# Patient Record
Sex: Male | Born: 1935 | Hispanic: No | Smoking: Never smoker
Health system: Southern US, Community
[De-identification: ages and names within clinical notes are randomized; demographics above are authoritative.]

## PROBLEM LIST (undated history)

## (undated) DIAGNOSIS — K219 Gastro-esophageal reflux disease without esophagitis: Secondary | ICD-10-CM

## (undated) DIAGNOSIS — I10 Essential (primary) hypertension: Secondary | ICD-10-CM

## (undated) DIAGNOSIS — E039 Hypothyroidism, unspecified: Secondary | ICD-10-CM

## (undated) DIAGNOSIS — R569 Unspecified convulsions: Secondary | ICD-10-CM

## (undated) DIAGNOSIS — E785 Hyperlipidemia, unspecified: Secondary | ICD-10-CM

## (undated) DIAGNOSIS — J302 Other seasonal allergic rhinitis: Secondary | ICD-10-CM

## (undated) HISTORY — PX: JOINT REPLACEMENT: SHX530

## (undated) HISTORY — PX: REPLACEMENT TOTAL KNEE: SUR1224

---

## 2005-02-09 ENCOUNTER — Encounter: Payer: Self-pay | Admitting: *Deleted

## 2005-02-24 ENCOUNTER — Encounter: Payer: Self-pay | Admitting: *Deleted

## 2005-06-05 ENCOUNTER — Ambulatory Visit: Payer: Self-pay | Admitting: Psychiatry

## 2005-08-20 ENCOUNTER — Other Ambulatory Visit: Payer: Self-pay

## 2005-08-20 ENCOUNTER — Emergency Department: Payer: Self-pay | Admitting: Emergency Medicine

## 2005-11-17 ENCOUNTER — Emergency Department: Payer: Self-pay | Admitting: Emergency Medicine

## 2006-02-25 ENCOUNTER — Other Ambulatory Visit: Payer: Self-pay

## 2006-02-25 ENCOUNTER — Emergency Department: Payer: Self-pay

## 2006-03-25 ENCOUNTER — Emergency Department: Payer: Self-pay | Admitting: Emergency Medicine

## 2006-04-13 ENCOUNTER — Ambulatory Visit: Payer: Self-pay | Admitting: Psychiatry

## 2006-04-30 ENCOUNTER — Inpatient Hospital Stay: Payer: Self-pay | Admitting: Internal Medicine

## 2006-04-30 ENCOUNTER — Other Ambulatory Visit: Payer: Self-pay

## 2006-05-09 ENCOUNTER — Emergency Department: Payer: Self-pay | Admitting: Emergency Medicine

## 2006-05-09 ENCOUNTER — Other Ambulatory Visit: Payer: Self-pay

## 2006-05-12 ENCOUNTER — Ambulatory Visit: Payer: Self-pay | Admitting: Psychiatry

## 2006-12-18 IMAGING — XA DG OUTSIDE FILMS CHEST
10 of 15 series · 15 of 24 positions shown · non-contrast
Comparison: none

[Series 1: run · 3 of 48 slices shown (1 of 10)]
[im 1/48]
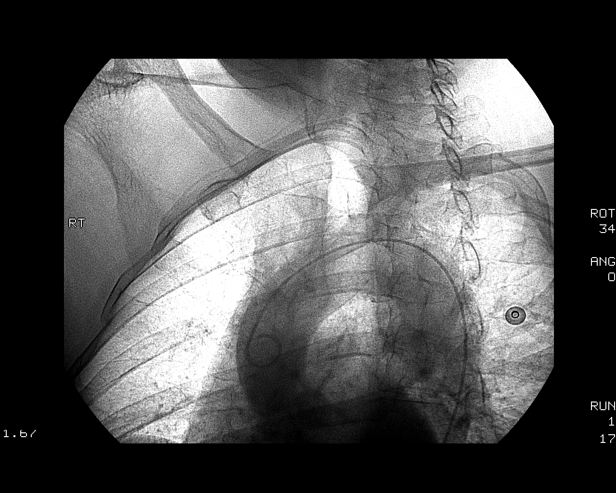
[im 24/48]
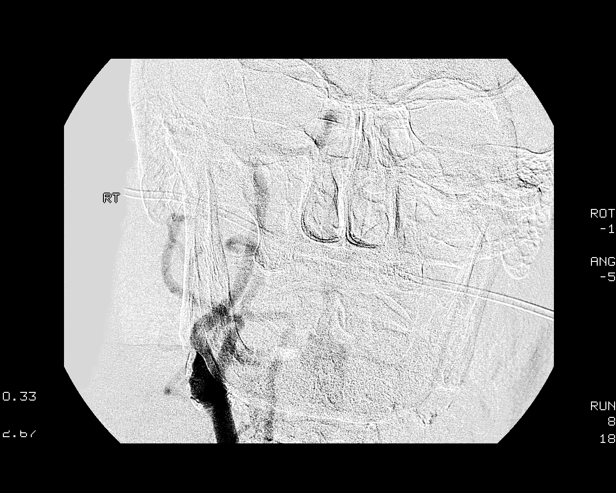
[im 48/48]
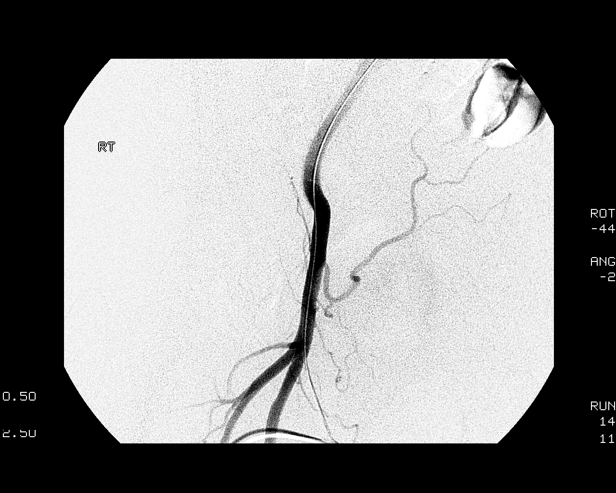

[Series 1: run · 1 of 17 slices shown (2 of 10)]
[im 1/17]
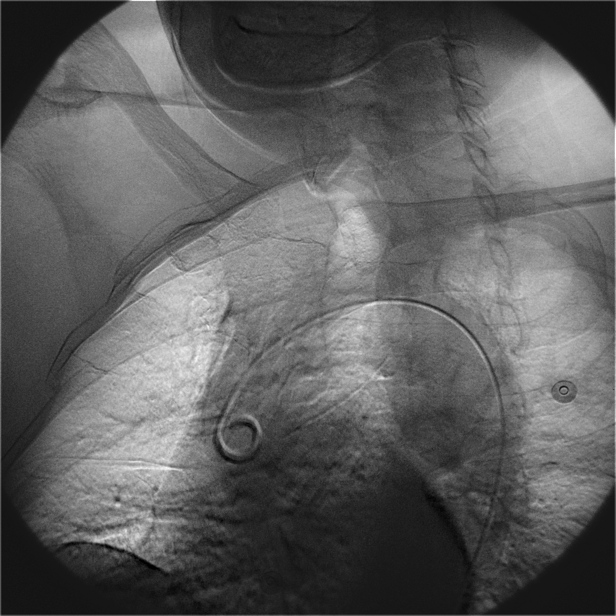

[Series 2: run · 1 of 11 slices shown (3 of 10)]
[im 1/11]
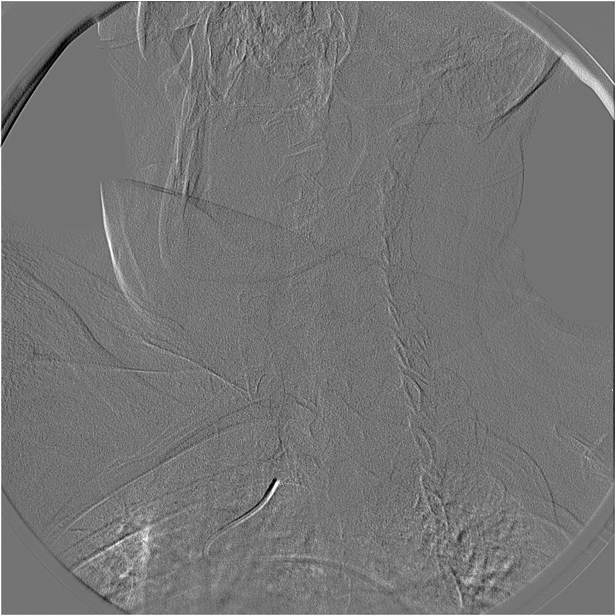

[Series 3: run · 1 of 17 slices shown (4 of 10)]
[im 1/17]
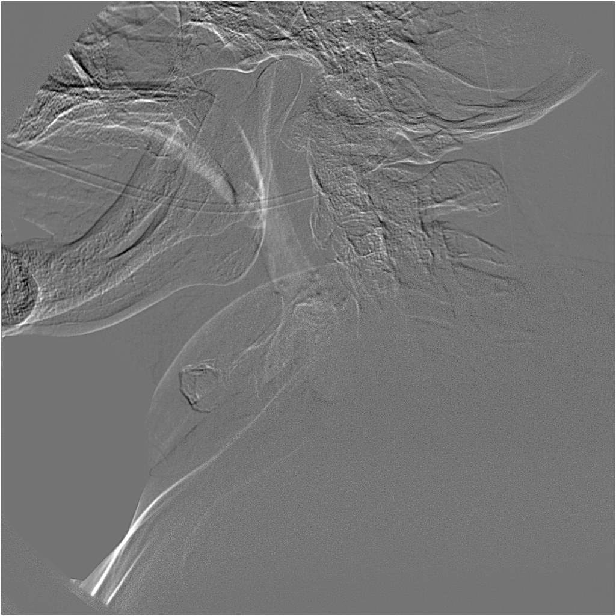

[Series 5: run · 1 of 17 slices shown (5 of 10)]
[im 1/17]
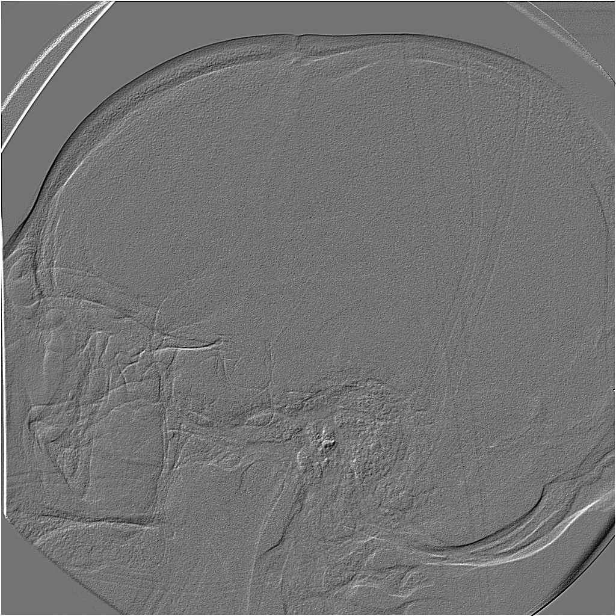

[Series 7: run · 2 of 24 slices shown (6 of 10)]
[im 1/24]
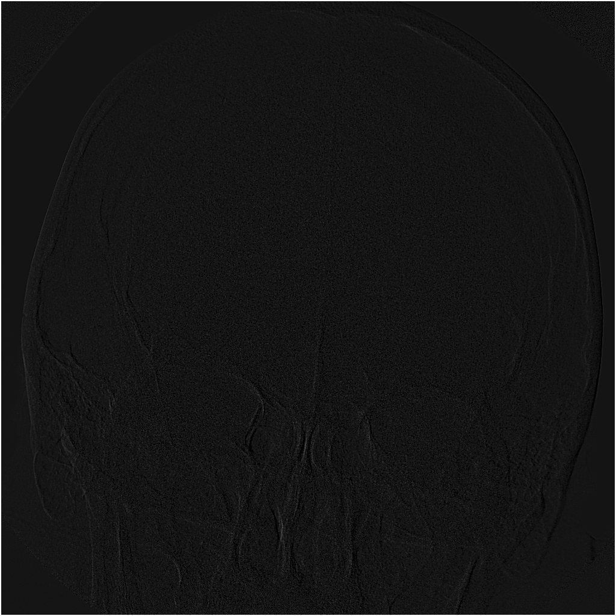
[im 24/24]
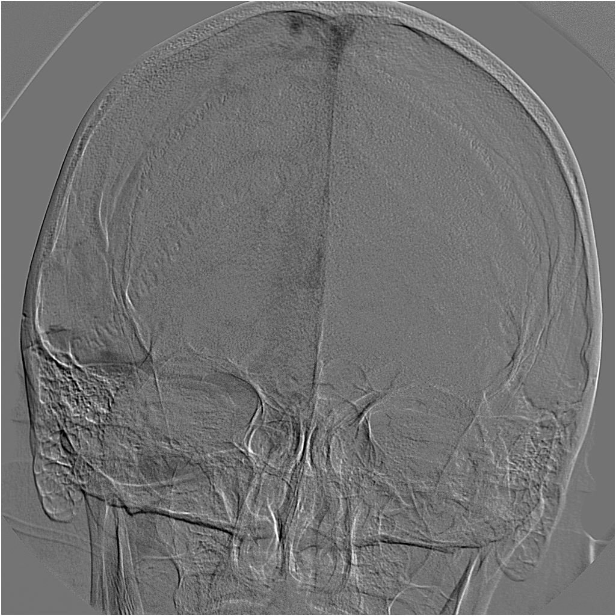

[Series 9: run · 2 of 20 slices shown (7 of 10)]
[im 1/20]
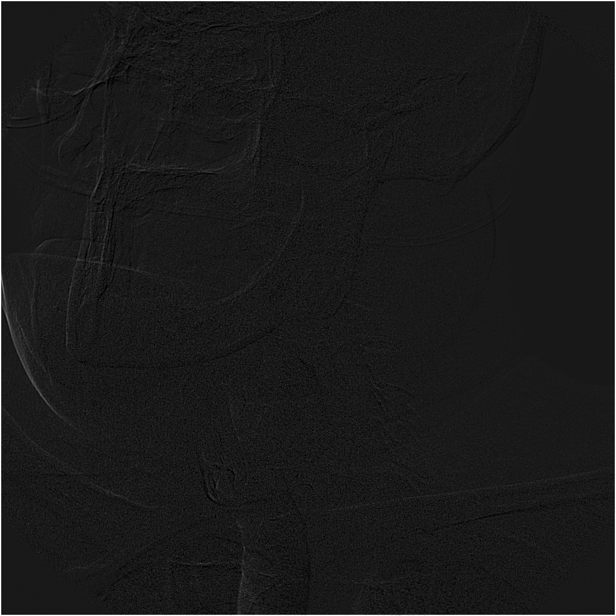
[im 20/20]
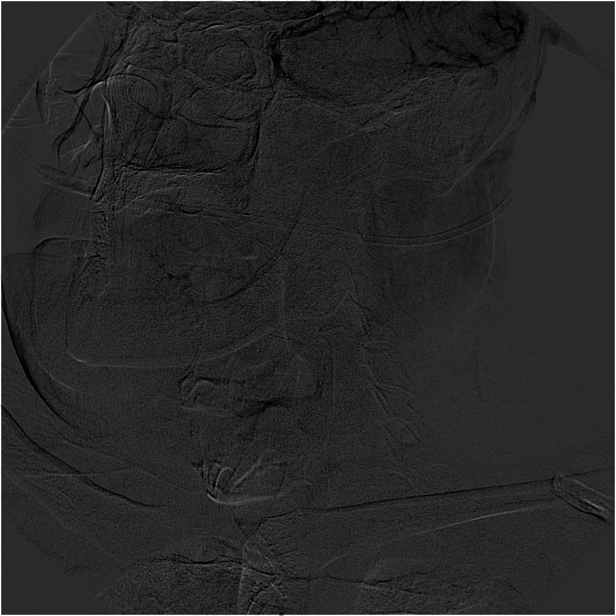

[Series 11: run · 1 of 21 slices shown (8 of 10)]
[im 1/21]
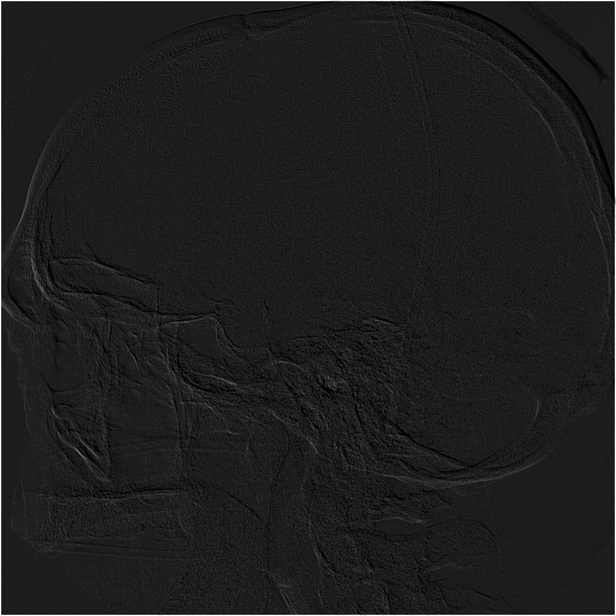

[Series 12: run · 2 of 26 slices shown (9 of 10)]
[im 1/26]
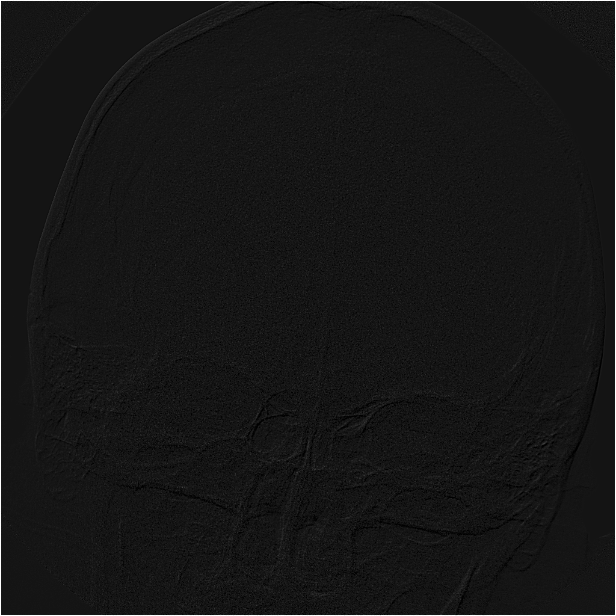
[im 26/26]
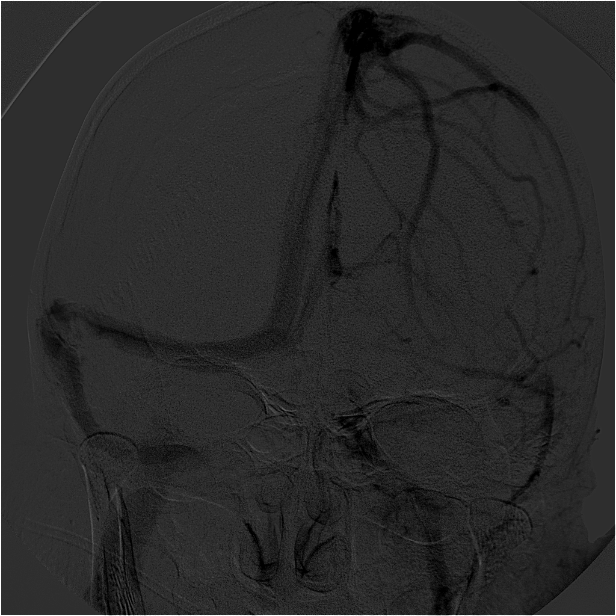

[Series 14: run · 1 of 11 slices shown (10 of 10)]
[im 1/11]
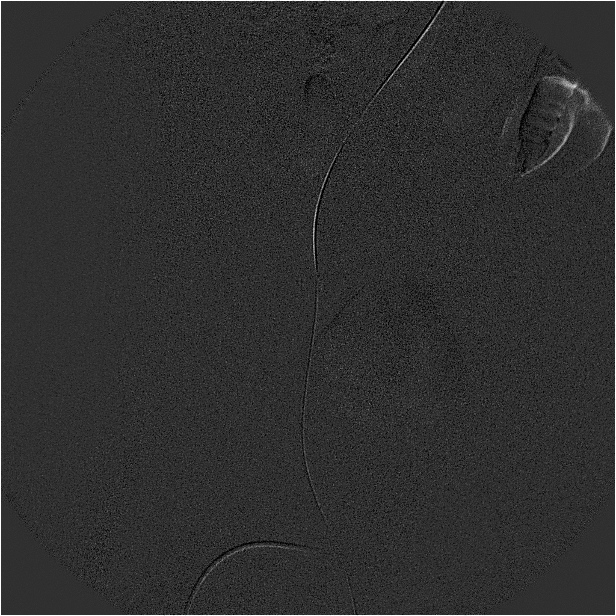

[15 of 24 positions shown; findings below may reference images not displayed]

**** An original report or order could not be provided from the [HOSPITAL] [facility] ****

## 2006-12-20 IMAGING — CR DG CHEST 1V PORT
1 series · 1 of 1 positions shown · non-contrast
Comparison: none

REASON FOR EXAM: Chest pain
COMMENTS:

PROCEDURE:     DXR - DXR PORTABLE CHEST SINGLE VIEW  - May 09, 2006 [DATE]
RESULT:     A single AP portable exam was obtained and compared to
04/30/2006.
The heart is normal in size. The lung fields are clear. The vascularity is
within normal limits. No effusions are seen.

[view not recorded]
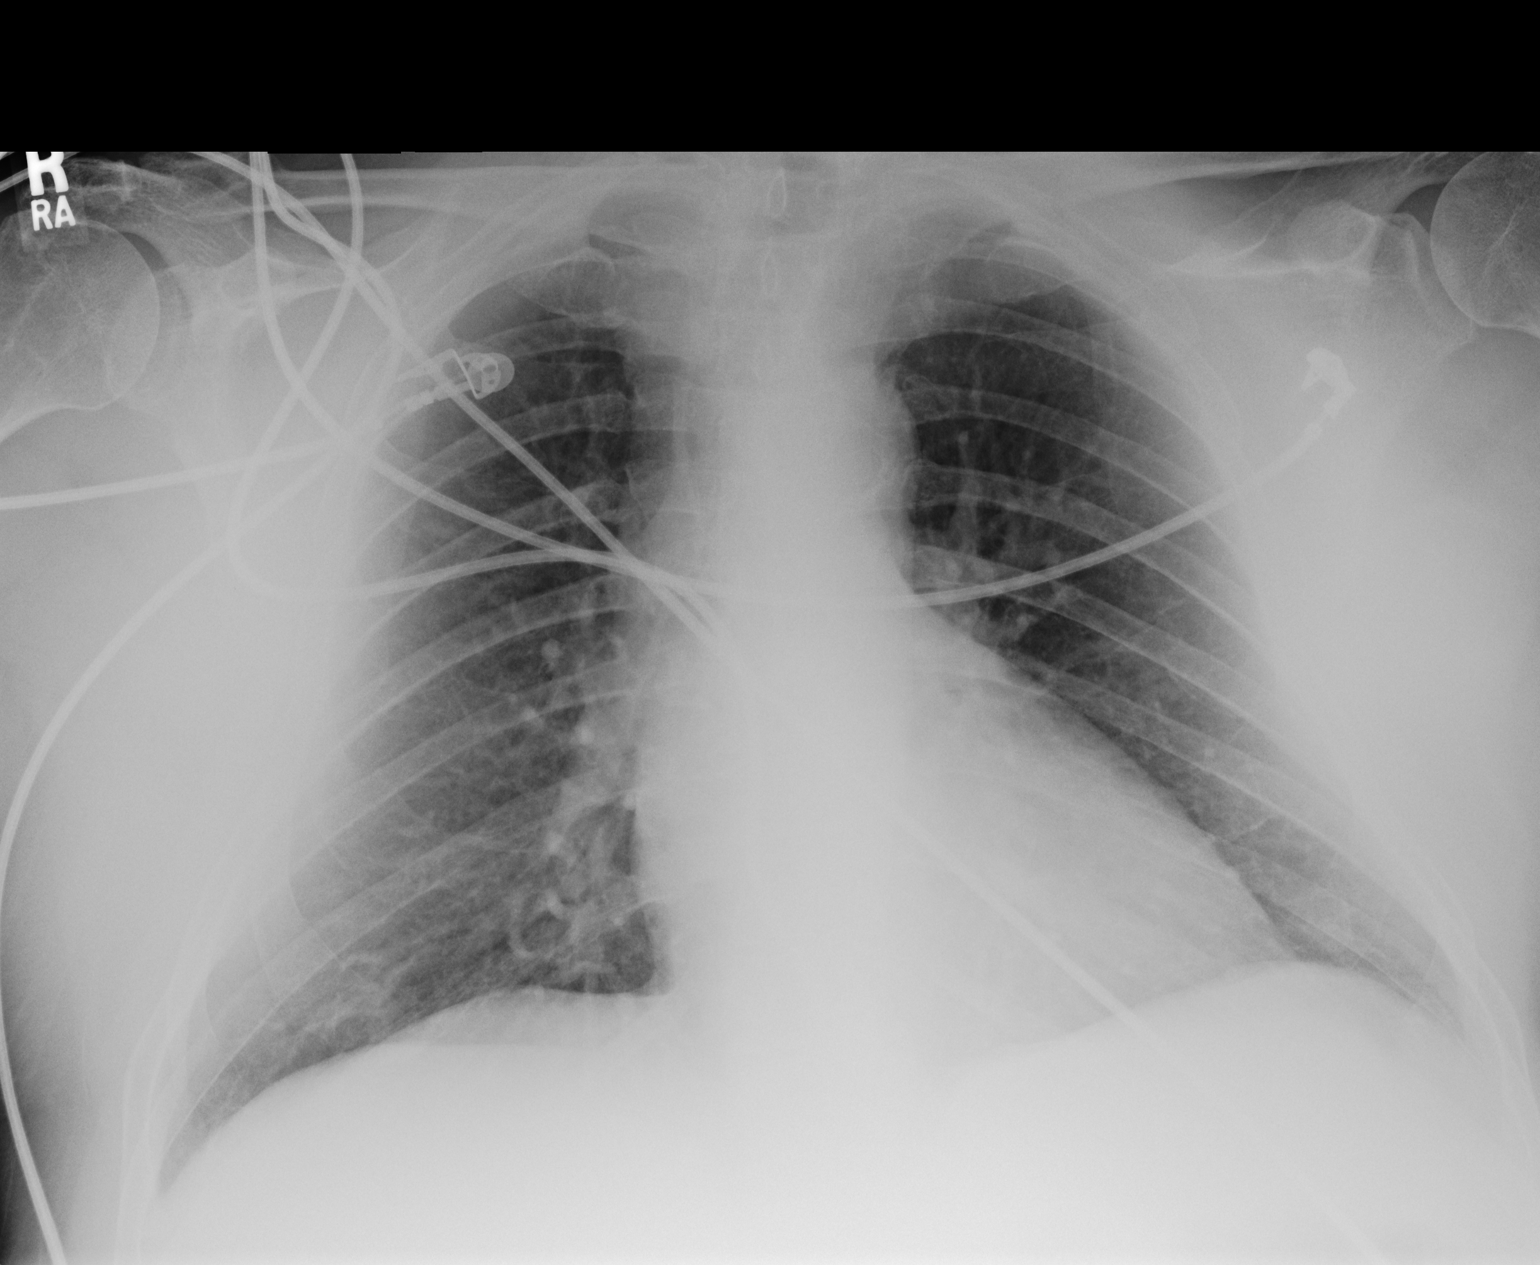

[1 of 1 positions shown; findings below may reference images not displayed]

IMPRESSION: The lung fields are clear.

## 2007-03-15 ENCOUNTER — Ambulatory Visit: Payer: Self-pay | Admitting: Unknown Physician Specialty

## 2007-04-04 ENCOUNTER — Other Ambulatory Visit: Payer: Self-pay

## 2007-04-04 ENCOUNTER — Ambulatory Visit: Payer: Self-pay | Admitting: Internal Medicine

## 2007-08-16 ENCOUNTER — Ambulatory Visit: Payer: Self-pay | Admitting: Family Medicine

## 2007-10-31 ENCOUNTER — Ambulatory Visit: Payer: Self-pay | Admitting: Family Medicine

## 2008-11-19 ENCOUNTER — Ambulatory Visit: Payer: Self-pay | Admitting: Unknown Physician Specialty

## 2008-11-24 ENCOUNTER — Inpatient Hospital Stay: Payer: Self-pay | Admitting: Unknown Physician Specialty

## 2009-02-02 ENCOUNTER — Emergency Department: Payer: Self-pay | Admitting: Emergency Medicine

## 2009-10-27 ENCOUNTER — Ambulatory Visit: Payer: Self-pay | Admitting: Unknown Physician Specialty

## 2009-12-20 ENCOUNTER — Emergency Department: Payer: Self-pay | Admitting: Emergency Medicine

## 2010-03-02 ENCOUNTER — Emergency Department: Payer: Self-pay | Admitting: Emergency Medicine

## 2010-05-18 ENCOUNTER — Emergency Department: Payer: Self-pay | Admitting: Emergency Medicine

## 2010-07-12 ENCOUNTER — Emergency Department: Payer: Self-pay | Admitting: Internal Medicine

## 2010-07-21 ENCOUNTER — Emergency Department: Payer: Self-pay | Admitting: Unknown Physician Specialty

## 2010-09-19 ENCOUNTER — Emergency Department: Payer: Self-pay | Admitting: Emergency Medicine

## 2010-09-20 ENCOUNTER — Emergency Department: Payer: Self-pay | Admitting: Emergency Medicine

## 2010-12-03 ENCOUNTER — Emergency Department: Payer: Self-pay | Admitting: Emergency Medicine

## 2010-12-10 ENCOUNTER — Emergency Department: Payer: Self-pay | Admitting: Emergency Medicine

## 2011-04-24 ENCOUNTER — Emergency Department: Payer: Self-pay | Admitting: Unknown Physician Specialty

## 2011-05-26 ENCOUNTER — Emergency Department: Payer: Self-pay | Admitting: Internal Medicine

## 2011-11-10 ENCOUNTER — Ambulatory Visit: Payer: Self-pay | Admitting: Urology

## 2012-08-14 ENCOUNTER — Ambulatory Visit: Payer: Self-pay | Admitting: Family Medicine

## 2012-12-09 ENCOUNTER — Ambulatory Visit: Payer: Self-pay | Admitting: Orthopedic Surgery

## 2013-03-07 ENCOUNTER — Emergency Department: Payer: Self-pay | Admitting: Emergency Medicine

## 2013-08-08 ENCOUNTER — Emergency Department: Payer: Self-pay | Admitting: Emergency Medicine

## 2013-10-24 ENCOUNTER — Emergency Department: Payer: Self-pay | Admitting: Emergency Medicine

## 2013-10-24 LAB — COMPREHENSIVE METABOLIC PANEL
ALT: 14 U/L (ref 12–78)
ANION GAP: 7 (ref 7–16)
Albumin: 3.4 g/dL (ref 3.4–5.0)
Alkaline Phosphatase: 89 U/L
BILIRUBIN TOTAL: 0.5 mg/dL (ref 0.2–1.0)
BUN: 16 mg/dL (ref 7–18)
CHLORIDE: 99 mmol/L (ref 98–107)
Calcium, Total: 9.2 mg/dL (ref 8.5–10.1)
Co2: 31 mmol/L (ref 21–32)
Creatinine: 1.08 mg/dL (ref 0.60–1.30)
EGFR (African American): >60
EGFR (Non-African Amer.): >60
Glucose: 115 mg/dL — ABNORMAL HIGH (ref 65–99)
Osmolality: 276 (ref 275–301)
Potassium: 3.5 mmol/L (ref 3.5–5.1)
SGOT(AST): 22 U/L (ref 15–37)
Sodium: 137 mmol/L (ref 136–145)
Total Protein: 8.4 g/dL — ABNORMAL HIGH (ref 6.4–8.2)

## 2013-10-24 LAB — CBC
HCT: 43.2 % (ref 40.0–52.0)
HGB: 14.8 g/dL (ref 13.0–18.0)
MCH: 32.5 pg (ref 26.0–34.0)
MCHC: 34.1 g/dL (ref 32.0–36.0)
MCV: 95 fL (ref 80–100)
Platelet: 167 10*3/uL (ref 150–440)
RBC: 4.55 10*6/uL (ref 4.40–5.90)
RDW: 13.4 % (ref 11.5–14.5)
WBC: 5.2 10*3/uL (ref 3.8–10.6)

## 2013-10-24 LAB — TROPONIN I: Troponin-I: <0.02 ng/mL

## 2013-10-24 LAB — PRO B NATRIURETIC PEPTIDE: B-Type Natriuretic Peptide: 78 pg/mL (ref 0–450)

## 2013-10-31 ENCOUNTER — Ambulatory Visit: Payer: Self-pay | Admitting: Unknown Physician Specialty

## 2013-11-03 LAB — PATHOLOGY REPORT

## 2013-11-24 ENCOUNTER — Emergency Department: Payer: Self-pay | Admitting: Emergency Medicine

## 2014-01-07 ENCOUNTER — Ambulatory Visit: Payer: Self-pay | Admitting: Physician Assistant

## 2014-04-01 ENCOUNTER — Emergency Department: Payer: Self-pay | Admitting: Emergency Medicine

## 2014-04-03 ENCOUNTER — Emergency Department: Payer: Self-pay | Admitting: Emergency Medicine

## 2014-07-11 ENCOUNTER — Emergency Department: Payer: Self-pay | Admitting: Emergency Medicine

## 2015-02-11 ENCOUNTER — Emergency Department
Admission: EM | Admit: 2015-02-11 | Discharge: 2015-02-11 | Disposition: A | Discharge status: Home / Self Care | Payer: Medicare Other | Attending: Emergency Medicine | Admitting: Emergency Medicine

## 2015-02-11 ENCOUNTER — Emergency Department: Payer: Medicare Other

## 2015-02-11 ENCOUNTER — Encounter: Payer: Self-pay | Admitting: Emergency Medicine

## 2015-02-11 ENCOUNTER — Other Ambulatory Visit: Payer: Self-pay

## 2015-02-11 DIAGNOSIS — R05 Cough: Secondary | ICD-10-CM

## 2015-02-11 DIAGNOSIS — J069 Acute upper respiratory infection, unspecified: Secondary | ICD-10-CM | POA: Diagnosis not present

## 2015-02-11 DIAGNOSIS — R059 Cough, unspecified: Secondary | ICD-10-CM

## 2015-02-11 LAB — BASIC METABOLIC PANEL
ANION GAP: 9 (ref 5–15)
BUN: 15 mg/dL (ref 6–20)
CO2: 26 mmol/L (ref 22–32)
Calcium: 8.6 mg/dL — ABNORMAL LOW (ref 8.9–10.3)
Chloride: 102 mmol/L (ref 101–111)
Creatinine, Ser: 1.06 mg/dL (ref 0.61–1.24)
GFR calc Af Amer: >60 mL/min (ref >60)
GLUCOSE: 217 mg/dL — AB (ref 65–99)
POTASSIUM: 3.2 mmol/L — AB (ref 3.5–5.1)
Sodium: 137 mmol/L (ref 135–145)

## 2015-02-11 LAB — CBC
HEMATOCRIT: 38.7 % — AB (ref 40.0–52.0)
HEMOGLOBIN: 13.2 g/dL (ref 13.0–18.0)
MCH: 31.7 pg (ref 26.0–34.0)
MCHC: 34.1 g/dL (ref 32.0–36.0)
MCV: 92.8 fL (ref 80.0–100.0)
Platelets: 153 10*3/uL (ref 150–440)
RBC: 4.17 MIL/uL — ABNORMAL LOW (ref 4.40–5.90)
RDW: 13.6 % (ref 11.5–14.5)
WBC: 5.3 10*3/uL (ref 3.8–10.6)

## 2015-02-11 LAB — TROPONIN I: Troponin I: <0.03 ng/mL (ref <0.031)

## 2015-02-11 MED ORDER — CHLORPHENIRAMINE-CODEINE 2-8 MG/5ML PO LIQD: 5.0000 mL | ORAL | Status: DC | PRN

## 2015-02-11 MED ORDER — ALBUTEROL SULFATE HFA 108 (90 BASE) MCG/ACT IN AERS: 2.0000 | INHALATION_SPRAY | Freq: Four times a day (QID) | RESPIRATORY_TRACT | Status: DC | PRN

## 2015-02-11 NOTE — ED Provider Notes (Signed)
[facility] Emergency Department Provider Note     Time seen: ----------------------------------------- 3:44 PM on 02/11/2015 -----------------------------------------    I have reviewed the triage vital signs and the nursing notes.   HISTORY  Chief Complaint Cough and Chest Pain    HPI Kevin Burns is a 81 y.o. male who presents to ER with cough and sore throat for a week. Symptoms have been worse over the last 2 days. He is a nonsmoker, no chest pain or shortness of breath or fever. Patient has had some green sputum production, has had some left-sided neck soreness.   No past medical history on file.  There are no active problems to display for this patient.   No past surgical history on file.  Allergies Review of patient's allergies indicates not on file.  Social History Social History  Substance Use Topics  . Smoking status: Never Smoker   . Smokeless tobacco: Not on file  . Alcohol Use: No    Review of Systems Constitutional: Negative for fever. Eyes: Negative for visual changes. ENT: Positive for sore throat Cardiovascular: Negative for chest pain. Positive for productive cough Respiratory: Negative for shortness of breath. Gastrointestinal: Negative for abdominal pain, vomiting and diarrhea. Genitourinary: Negative for dysuria. Musculoskeletal: Negative for back pain. Skin: Negative for rash. Neurological: Negative for headaches, focal weakness or numbness.  10-point ROS otherwise negative.  ____________________________________________   PHYSICAL EXAM:  VITAL SIGNS: ED Triage Vitals  Enc Vitals Group     BP 02/11/15 1249 151/89 mmHg     Pulse Rate 02/11/15 1249 48     Resp --      Temp 02/11/15 1249 97.6 F (36.4 C)     Temp Source 02/11/15 1249 Oral     SpO2 02/11/15 1249 97 %     Weight 02/11/15 1249 260 lb (117.935 kg)     Height 02/11/15 1249  (1.626 m)     Head Cir --      Peak Flow --      Pain Score  02/11/15 1245 4     Pain Loc --      Pain Edu? --      Excl. in GC? --     Constitutional: Alert and oriented. Well appearing and in no distress. Eyes: Conjunctivae are normal. PERRL. Normal extraocular movements. ENT   Head: Normocephalic and atraumatic.   Nose: No congestion/rhinnorhea.   Mouth/Throat: Mucous membranes are moist.   Neck: No stridor. Cardiovascular: Normal rate, regular rhythm. Normal and symmetric distal pulses are present in all extremities. No murmurs, rubs, or gallops. Respiratory: Normal respiratory effort without tachypnea nor retractions. Breath sounds are clear and equal bilaterally. No wheezes/rales/rhonchi. Gastrointestinal: Soft and nontender. No distention. No abdominal bruits.  Musculoskeletal: Nontender with normal range of motion in all extremities. No joint effusions.  No lower extremity tenderness nor edema. Neurologic:  Normal speech and language. No gross focal neurologic deficits are appreciated. Speech is normal. No gait instability. Skin:  Skin is warm, dry and intact. No rash noted. Psychiatric: Mood and affect are normal. Speech and behavior are normal. Patient exhibits appropriate insight and judgment. ____________________________________________  EKG: Interpreted by me. Sinus bradycardia with rate of 52 bpm, left axis deviation, right bundle branch block, no evidence of acute infarction, EKG is unchanged from January 2016  ____________________________________________  ED COURSE:  Pertinent labs & imaging results that were available during my care of the patient were reviewed by me and considered in my medical decision making (  see chart for details). Patient is no acute distress, slightly bronchitis or just upper respiratory tract infection. ____________________________________________    LABS (pertinent positives/negatives)  Labs Reviewed  BASIC METABOLIC PANEL - Abnormal; Notable for the following:    Potassium 3.2 (*)     Glucose, Bld 217 (*)    Calcium 8.6 (*)    All other components within normal limits  CBC - Abnormal; Notable for the following:    RBC 4.17 (*)    HCT 38.7 (*)    All other components within normal limits  TROPONIN I    RADIOLOGY Images were viewed by me  FINDINGS: Cardiomediastinal silhouette is stable. No acute infiltrate or pleural effusion. No pulmonary edema. Mild degenerative changes thoracic spine.  IMPRESSION: No active cardiopulmonary disease.  ____________________________________________  FINAL ASSESSMENT AND PLAN  Cough, sore throat  Plan: Patient with labs and imaging as dictated above. Symptoms are likely viral. He'll be discharged with a cough syrup that contains codeine and albuterol inhaler, currently heart rate is unremarkable. He stable for outpatient follow-up with his doctor   Emily Filbert, MD   Emily Filbert, MD 02/11/15 [phone]

## 2015-02-11 NOTE — ED Notes (Signed)
Pt has a cough, sore throat for 1 week.  Sx worse for past 2 days.   Nonsmoker.  No chest pain or sob. No fever

## 2015-02-11 NOTE — Discharge Instructions (Signed)
Cough, Adult ° A cough is a reflex that helps clear your throat and airways. It can help heal the body or may be a reaction to an irritated airway. A cough may only last 2 or 3 weeks (acute) or may last more than 8 weeks (chronic).  °CAUSES °Acute cough: °· Viral or bacterial infections. °Chronic cough: °· Infections. °· Allergies. °· Asthma. °· Post-nasal drip. °· Smoking. °· Heartburn or acid reflux. °· Some medicines. °· Chronic lung problems (COPD). °· Cancer. °SYMPTOMS  °· Cough. °· Fever. °· Chest pain. °· Increased breathing rate. °· High-pitched whistling sound when breathing (wheezing). °· Colored mucus that you cough up (sputum). °TREATMENT  °· A bacterial cough may be treated with antibiotic medicine. °· A viral cough must run its course and will not respond to antibiotics. °· Your caregiver may recommend other treatments if you have a chronic cough. °HOME CARE INSTRUCTIONS  °· Only take over-the-counter or prescription medicines for pain, discomfort, or fever as directed by your caregiver. Use cough suppressants only as directed by your caregiver. °· Use a cold steam vaporizer or humidifier in your bedroom or home to help loosen secretions. °· Sleep in a semi-upright position if your cough is worse at night. °· Rest as needed. °· Stop smoking if you smoke. °SEEK IMMEDIATE MEDICAL CARE IF:  °· You have pus in your sputum. °· Your cough starts to worsen. °· You cannot control your cough with suppressants and are losing sleep. °· You begin coughing up blood. °· You have difficulty breathing. °· You develop pain which is getting worse or is uncontrolled with medicine. °· You have a fever. °MAKE SURE YOU:  °· Understand these instructions. °· Will watch your condition. °· Will get help right away if you are not doing well or get worse. °Document Released: 12/09/2010 Document Revised: 09/04/2011 Document Reviewed: 12/09/2010 °ExitCare® Patient Information ©2015 [employer]. This information is not intended  to replace advice given to you by your health care provider. Make sure you discuss any questions you have with your health care provider. °Upper Respiratory Infection, Adult °An upper respiratory infection (URI) is also sometimes known as the common cold. The upper respiratory tract includes the nose, sinuses, throat, trachea, and bronchi. Bronchi are the airways leading to the lungs. Most people improve within 1 week, but symptoms can last up to 2 weeks. A residual cough may last even longer.  °CAUSES °Many different viruses can infect the tissues lining the upper respiratory tract. The tissues become irritated and inflamed and often become very moist. Mucus production is also common. A cold is contagious. You can easily spread the virus to others by oral contact. This includes kissing, sharing a glass, coughing, or sneezing. Touching your mouth or nose and then touching a surface, which is then touched by another person, can also spread the virus. °SYMPTOMS  °Symptoms typically develop 1 to 3 days after you come in contact with a cold virus. Symptoms vary from person to person. They may include: °· Runny nose. °· Sneezing. °· Nasal congestion. °· Sinus irritation. °· Sore throat. °· Loss of voice (laryngitis). °· Cough. °· Fatigue. °· Muscle aches. °· Loss of appetite. °· Headache. °· Low-grade fever. °DIAGNOSIS  °You might diagnose your own cold based on familiar symptoms, since most people get a cold 2 to 3 times a year. Your caregiver can confirm this based on your exam. Most importantly, your caregiver can check that your symptoms are not due to another disease such   as strep throat, sinusitis, pneumonia, asthma, or epiglottitis. Blood tests, throat tests, and X-rays are not necessary to diagnose a common cold, but they may sometimes be helpful in excluding other more serious diseases. Your caregiver will decide if any further tests are required. °RISKS AND COMPLICATIONS  °You may be at risk for a more severe  case of the common cold if you smoke cigarettes, have chronic heart disease (such as heart failure) or lung disease (such as asthma), or if you have a weakened immune system. The very young and very old are also at risk for more serious infections. Bacterial sinusitis, middle ear infections, and bacterial pneumonia can complicate the common cold. The common cold can worsen asthma and chronic obstructive pulmonary disease (COPD). Sometimes, these complications can require emergency medical care and may be life-threatening. °PREVENTION  °The best way to protect against getting a cold is to practice good hygiene. Avoid oral or hand contact with people with cold symptoms. Wash your hands often if contact occurs. There is no clear evidence that vitamin C, vitamin E, echinacea, or exercise reduces the chance of developing a cold. However, it is always recommended to get plenty of rest and practice good nutrition. °TREATMENT  °Treatment is directed at relieving symptoms. There is no cure. Antibiotics are not effective, because the infection is caused by a virus, not by bacteria. Treatment may include: °· Increased fluid intake. Sports drinks offer valuable electrolytes, sugars, and fluids. °· Breathing heated mist or steam (vaporizer or shower). °· Eating chicken soup or other clear broths, and maintaining good nutrition. °· Getting plenty of rest. °· Using gargles or lozenges for comfort. °· Controlling fevers with ibuprofen or acetaminophen as directed by your caregiver. °· Increasing usage of your inhaler if you have asthma. °Zinc gel and zinc lozenges, taken in the first 24 hours of the common cold, can shorten the duration and lessen the severity of symptoms. Pain medicines may help with fever, muscle aches, and throat pain. A variety of non-prescription medicines are available to treat congestion and runny nose. Your caregiver can make recommendations and may suggest nasal or lung inhalers for other symptoms.  °HOME  CARE INSTRUCTIONS  °· Only take over-the-counter or prescription medicines for pain, discomfort, or fever as directed by your caregiver. °· Use a warm mist humidifier or inhale steam from a shower to increase air moisture. This may keep secretions moist and make it easier to breathe. °· Drink enough water and fluids to keep your urine clear or pale yellow. °· Rest as needed. °· Return to work when your temperature has returned to normal or as your caregiver advises. You may need to stay home longer to avoid infecting others. You can also use a face mask and careful hand washing to prevent spread of the virus. °SEEK MEDICAL CARE IF:  °· After the first few days, you feel you are getting worse rather than better. °· You need your caregiver's advice about medicines to control symptoms. °· You develop chills, worsening shortness of breath, or brown or red sputum. These may be signs of pneumonia. °· You develop yellow or brown nasal discharge or pain in the face, especially when you bend forward. These may be signs of sinusitis. °· You develop a fever, swollen neck glands, pain with swallowing, or white areas in the back of your throat. These may be signs of strep throat. °SEEK IMMEDIATE MEDICAL CARE IF:  °· You have a fever. °· You develop severe or persistent headache, ear   pain, sinus pain, or chest pain. °· You develop wheezing, a prolonged cough, cough up blood, or have a change in your usual mucus (if you have chronic lung disease). °· You develop sore muscles or a stiff neck. °Document Released: 12/06/2000 Document Revised: 09/04/2011 Document Reviewed: 09/17/2013 °ExitCare® Patient Information ©2015 [employer]. This information is not intended to replace advice given to you by your health care provider. Make sure you discuss any questions you have with your health care provider. ° °

## 2015-02-11 NOTE — ED Notes (Signed)
Patient to ED with report of cough and congestion for a few days, reports some chest tightness and production of [name] phlegm. Patient also reports some neck soreness.

## 2015-02-13 ENCOUNTER — Emergency Department: Payer: Medicare Other

## 2015-02-13 ENCOUNTER — Emergency Department
Admission: EM | Admit: 2015-02-13 | Discharge: 2015-02-14 | Disposition: A | Discharge status: Home / Self Care | Payer: Medicare Other | Attending: Emergency Medicine | Admitting: Emergency Medicine

## 2015-02-13 ENCOUNTER — Encounter: Payer: Self-pay | Admitting: Emergency Medicine

## 2015-02-13 ENCOUNTER — Other Ambulatory Visit: Payer: Self-pay

## 2015-02-13 DIAGNOSIS — Z79899 Other long term (current) drug therapy: Secondary | ICD-10-CM | POA: Insufficient documentation

## 2015-02-13 DIAGNOSIS — J189 Pneumonia, unspecified organism: Secondary | ICD-10-CM

## 2015-02-13 DIAGNOSIS — J159 Unspecified bacterial pneumonia: Secondary | ICD-10-CM | POA: Insufficient documentation

## 2015-02-13 DIAGNOSIS — R05 Cough: Secondary | ICD-10-CM | POA: Diagnosis present

## 2015-02-13 DIAGNOSIS — I10 Essential (primary) hypertension: Secondary | ICD-10-CM | POA: Diagnosis not present

## 2015-02-13 DIAGNOSIS — Z7902 Long term (current) use of antithrombotics/antiplatelets: Secondary | ICD-10-CM | POA: Insufficient documentation

## 2015-02-13 HISTORY — DX: Unspecified convulsions: R56.9

## 2015-02-13 HISTORY — DX: Essential (primary) hypertension: I10

## 2015-02-13 LAB — COMPREHENSIVE METABOLIC PANEL
ALT: 11 U/L — ABNORMAL LOW (ref 17–63)
ANION GAP: 6 (ref 5–15)
AST: 38 U/L (ref 15–41)
Albumin: 3.9 g/dL (ref 3.5–5.0)
Alkaline Phosphatase: 93 U/L (ref 38–126)
BILIRUBIN TOTAL: 0.4 mg/dL (ref 0.3–1.2)
BUN: 12 mg/dL (ref 6–20)
CHLORIDE: 95 mmol/L — AB (ref 101–111)
CO2: 32 mmol/L (ref 22–32)
Calcium: 8.9 mg/dL (ref 8.9–10.3)
Creatinine, Ser: 1.15 mg/dL (ref 0.61–1.24)
GFR, EST NON AFRICAN AMERICAN: 58 mL/min — AB (ref >60)
Glucose, Bld: 141 mg/dL — ABNORMAL HIGH (ref 65–99)
POTASSIUM: 3.7 mmol/L (ref 3.5–5.1)
Sodium: 133 mmol/L — ABNORMAL LOW (ref 135–145)
TOTAL PROTEIN: 7.6 g/dL (ref 6.5–8.1)

## 2015-02-13 LAB — CBC
HEMATOCRIT: 39.9 % — AB (ref 40.0–52.0)
Hemoglobin: 13.8 g/dL (ref 13.0–18.0)
MCH: 31.8 pg (ref 26.0–34.0)
MCHC: 34.6 g/dL (ref 32.0–36.0)
MCV: 92 fL (ref 80.0–100.0)
PLATELETS: 140 10*3/uL — AB (ref 150–440)
RBC: 4.34 MIL/uL — ABNORMAL LOW (ref 4.40–5.90)
RDW: 13.5 % (ref 11.5–14.5)
WBC: 6.3 10*3/uL (ref 3.8–10.6)

## 2015-02-13 NOTE — ED Notes (Signed)
Pt with family returning from visit 3 days ago for cough.  Pt was prescribed Cheratussin and ventolin but has had no relief for cough.   Pt currently having productive cough, and c/o chest pain left and right side.  Hx of HTN and seizures.  Pt family reports coughs last that was quickly resolved with codeine and is worried that this is not resolving quick enough.

## 2015-02-14 DIAGNOSIS — J159 Unspecified bacterial pneumonia: Secondary | ICD-10-CM | POA: Diagnosis not present

## 2015-02-14 LAB — TROPONIN I: TROPONIN I: 0.03 ng/mL (ref <0.031)

## 2015-02-14 MED ORDER — DIAZEPAM 5 MG PO TABS: 5.0000 mg | ORAL_TABLET | Freq: Three times a day (TID) | ORAL | Status: DC | PRN

## 2015-02-14 MED ORDER — LEVOFLOXACIN 750 MG PO TABS
750.0000 mg | ORAL_TABLET | Freq: Once | ORAL | Status: AC
  Administered 2015-02-14: 750 mg via ORAL

## 2015-02-14 MED ORDER — DIAZEPAM 5 MG PO TABS
5.0000 mg | ORAL_TABLET | Freq: Once | ORAL | Status: AC
  Administered 2015-02-14: 5 mg via ORAL

## 2015-02-14 MED ORDER — LEVOFLOXACIN 750 MG PO TABS: 750.0000 mg | ORAL_TABLET | Freq: Every day | ORAL | Status: DC

## 2015-02-14 MED ORDER — LEVOFLOXACIN 250 MG PO TABS
ORAL_TABLET | ORAL | Status: AC
  Filled 2015-02-14: qty 1

## 2015-02-14 MED ORDER — LEVOFLOXACIN 500 MG PO TABS
ORAL_TABLET | ORAL | Status: AC
  Filled 2015-02-14: qty 1

## 2015-02-14 MED ORDER — DIAZEPAM 5 MG PO TABS
ORAL_TABLET | ORAL | Status: AC
  Filled 2015-02-14: qty 1

## 2015-02-14 NOTE — Discharge Instructions (Signed)

## 2015-02-14 NOTE — ED Provider Notes (Signed)
[facility] Emergency Department Provider Note  ____________________________________________  Time seen: 11:40 PM  I have reviewed the triage vital signs and the nursing notes.   HISTORY  Chief Complaint Chest Pain    HPI Kevin Burns is a 81 y.o. male who complains of productive cough for the past 10 days. Patient presents with his son, who both felt that he has not had any change in energy level or activity. He is eating and drinking normally.He was seen in the ED 2 days ago for a week of cough sore throat and runny nose and was prescribed Cheratussin. Despite this, his symptoms are only temporarily improved. Over the last couple days he's noticed that his cough seems to be more productive of green sputum. Denies fever chills chest pain shortness of breath. He only has discomfort in his bilateral ribs in the midaxillary line with forceful coughing. No syncope. No vomiting abdominal pain or back pain.     Past Medical History  Diagnosis Date  . Seizures   . Hypertension     There are no active problems to display for this patient.   Past Surgical History  Procedure Laterality Date  . Replacement total knee      right    Current Outpatient Rx  Name  Route  Sig  Dispense  Refill  . albuterol (PROVENTIL HFA;VENTOLIN HFA) 108 (90 BASE) MCG/ACT inhaler   Inhalation   Inhale 2 puffs into the lungs every 6 (six) hours as needed for wheezing or shortness of breath.   1 Inhaler   2   . amLODipine (NORVASC) 10 MG tablet   Oral   Take 1 tablet by mouth daily.         Marland Kitchen atorvastatin (LIPITOR) 80 MG tablet   Oral   Take 1 tablet by mouth daily.         . Chlorpheniramine-Codeine 2-8 MG/5ML LIQD   Oral   Take 5 mLs by mouth every 4 (four) hours as needed.   100 mL   0   . clopidogrel (PLAVIX) 75 MG tablet   Oral   Take 1 tablet by mouth daily.         . fluocinolone (VANOS) 0.01 % cream   Topical   Apply 1 application topically as  needed.         . levETIRAcetam (KEPPRA) 500 MG tablet   Oral   Take 5 tablets by mouth daily.         Marland Kitchen levothyroxine (SYNTHROID, LEVOTHROID) 100 MCG tablet   Oral   Take 1 tablet by mouth daily.         . metoprolol succinate (TOPROL-XL) 100 MG 24 hr tablet   Oral   Take 1.5 tablets by mouth daily.         Marland Kitchen omeprazole (PRILOSEC) 40 MG capsule   Oral   Take 1 capsule by mouth daily.         . phenytoin (DILANTIN) 100 MG ER capsule   Oral   Take 3 capsules by mouth daily.         . tamsulosin (FLOMAX) 0.4 MG CAPS capsule   Oral   Take 1 capsule by mouth daily.         Marland Kitchen triamterene-hydrochlorothiazide (MAXZIDE-25) 37.5-25 MG per tablet   Oral   Take 1 tablet by mouth daily.         Marland Kitchen VASCEPA 1 G CAPS   Oral   Take 1 tablet by mouth  daily.           Dispense as written.   . diazepam (VALIUM) 5 MG tablet   Oral   Take 1 tablet (5 mg total) by mouth every 8 (eight) hours as needed for muscle spasms.   8 tablet   0   . levofloxacin (LEVAQUIN) 750 MG tablet   Oral   Take 1 tablet (750 mg total) by mouth daily.   7 tablet   0     Allergies Review of patient's allergies indicates no known allergies.  History reviewed. No pertinent family history.  Social History Social History  Substance Use Topics  . Smoking status: Never Smoker   . Smokeless tobacco: None  . Alcohol Use: No    Review of Systems  Constitutional: No fever or chills. No weight changes Eyes:No blurry vision or double vision.  ENT: Positive sore throat. Cardiovascular: No chest pain. Respiratory: No dyspnea, positive productive cough. Gastrointestinal: Negative for abdominal pain, vomiting and diarrhea.  No BRBPR or melena. Genitourinary: Negative for dysuria, urinary retention, bloody urine, or difficulty urinating. Musculoskeletal: Negative for back pain. No joint swelling or pain. Skin: Negative for rash. Neurological: Negative for headaches, focal weakness or  numbness. Psychiatric:No anxiety or depression.   Endocrine:No hot/cold intolerance, changes in energy, or sleep difficulty.  10-point ROS otherwise negative.  ____________________________________________   PHYSICAL EXAM:  VITAL SIGNS: ED Triage Vitals  Enc Vitals Group     BP 02/13/15 2136 151/70 mmHg     Pulse Rate 02/13/15 2136 64     Resp 02/13/15 2158 18     Temp 02/13/15 2136 99.1 F (37.3 C)     Temp Source 02/13/15 2136 Oral     SpO2 02/13/15 2136 95 %     Weight 02/13/15 2136 265 lb (120.203 kg)     Height 02/13/15 2136 5\' 4"  (1.626 m)     Head Cir --      Peak Flow --      Pain Score 02/13/15 2136 8     Pain Loc --      Pain Edu? --      Excl. in GC? --      Constitutional: Alert and oriented. Well appearing and in no distress. Eyes: No scleral icterus. No conjunctival pallor. PERRL. EOMI ENT   Head: Normocephalic and atraumatic.   Nose: No congestion/rhinnorhea. No septal hematoma   Mouth/Throat: MMM, no pharyngeal erythema. No peritonsillar mass. No uvula shift.   Neck: No stridor. No SubQ emphysema. No meningismus. Hematological/Lymphatic/Immunilogical: No cervical lymphadenopathy. Cardiovascular: RRR. Normal and symmetric distal pulses are present in all extremities. No murmurs, rubs, or gallops. Respiratory: Normal respiratory effort without tachypnea nor retractions. Breath sounds are clear and equal bilaterally. No wheezes/rales/rhonchi. Gastrointestinal: Soft and nontender. No distention. There is no CVA tenderness.  No rebound, rigidity, or guarding. Genitourinary: deferred Musculoskeletal: Nontender with normal range of motion in all extremities. No joint effusions.  No lower extremity tenderness.  No edema. Neurologic:   Normal speech and language.  CN 2-10 normal. Motor grossly intact. No pronator drift.  Normal gait. No gross focal neurologic deficits are appreciated.  Skin:  Skin is warm, dry and intact. No rash noted.  No  petechiae, purpura, or bullae. Psychiatric: Mood and affect are normal. Speech and behavior are normal. Patient exhibits appropriate insight and judgment.  ____________________________________________    LABS (pertinent positives/negatives) (all labs ordered are listed, but only abnormal results are displayed) Labs Reviewed  CBC - Abnormal; Notable  for the following:    RBC 4.34 (*)    HCT 39.9 (*)    Platelets 140 (*)    All other components within normal limits  COMPREHENSIVE METABOLIC PANEL - Abnormal; Notable for the following:    Sodium 133 (*)    Chloride 95 (*)    Glucose, Bld 141 (*)    ALT 11 (*)    GFR calc non Af Amer 58 (*)    All other components within normal limits  TROPONIN I   ____________________________________________   EKG  Interpreted by me Normal sinus rhythm rate of 66, left axis, left anterior fascicular block. Normal ST segments and T waves.  ____________________________________________    RADIOLOGY  Chest x-ray suggests small infiltrate in the left lower lung overlying the cardiac silhouette.  ____________________________________________   PROCEDURES  ____________________________________________   INITIAL IMPRESSION / ASSESSMENT AND PLAN / ED COURSE  Pertinent labs & imaging results that were available during my care of the patient were reviewed by me and considered in my medical decision making (see chart for details).  Patient presents with worsened cough now going on for about 10 days. He also has a low-grade fever which I think is significant for this 81 year old man at 99.8. The clinical evaluation and chest x-ray are consistent with community acquired pneumonia. I offered the patient admission to the hospital for this due to his age and comorbidities, however he has very good support at home, and is maintaining his ADLs, and refuses hospitalization at this time. I think this is reasonable given his support system at home. We'll give  him Levaquin now and by prescription for the pneumonia. He is to continue taking Cheratussin for his symptoms and to help loosen secretions. I'll also add on Valium which she has taken before to help with his sleep comfort. I did consider that given his age and appears criteria that the comminution of Cheratussin and Valium may not be ideal, but I think in his case especially with his weight and the relatively low dose of these medications that he is taking, this will not cause too much sedation or respiratory suppression or other ill effects. I think it will only serve to alleviate his symptoms.  He is very comfortable in the room lying supine without were increased work of breathing. Sats are good final signs are good other than the low-grade fever. He is in no distress and nontoxic. We'll discharge him home according to his preference with outpatient management and follow-up with primary care. He sees Dr. Lucina Mellow at [facility].  ____________________________________________   FINAL CLINICAL IMPRESSION(S) / ED DIAGNOSES  Final diagnoses:  Community acquired pneumonia      Sharman Cheek, MD 02/14/15 [phone]

## 2015-02-21 ENCOUNTER — Emergency Department
Admission: EM | Admit: 2015-02-21 | Discharge: 2015-02-21 | Disposition: A | Discharge status: Home / Self Care | Payer: Medicare Other | Attending: Emergency Medicine | Admitting: Emergency Medicine

## 2015-02-21 ENCOUNTER — Encounter: Payer: Self-pay | Admitting: Emergency Medicine

## 2015-02-21 DIAGNOSIS — Z7902 Long term (current) use of antithrombotics/antiplatelets: Secondary | ICD-10-CM | POA: Diagnosis not present

## 2015-02-21 DIAGNOSIS — Z792 Long term (current) use of antibiotics: Secondary | ICD-10-CM | POA: Insufficient documentation

## 2015-02-21 DIAGNOSIS — M436 Torticollis: Secondary | ICD-10-CM | POA: Insufficient documentation

## 2015-02-21 DIAGNOSIS — Z79899 Other long term (current) drug therapy: Secondary | ICD-10-CM | POA: Insufficient documentation

## 2015-02-21 DIAGNOSIS — M542 Cervicalgia: Secondary | ICD-10-CM | POA: Diagnosis present

## 2015-02-21 DIAGNOSIS — I10 Essential (primary) hypertension: Secondary | ICD-10-CM | POA: Diagnosis not present

## 2015-02-21 MED ORDER — DIAZEPAM 5 MG/ML IJ SOLN
2.5000 mg | Freq: Once | INTRAMUSCULAR | Status: AC
  Administered 2015-02-21: 2.5 mg via INTRAMUSCULAR
  Filled 2015-02-21: qty 2

## 2015-02-21 MED ORDER — BENZONATATE 100 MG PO CAPS: 100.0000 mg | ORAL_CAPSULE | Freq: Three times a day (TID) | ORAL | Status: DC | PRN

## 2015-02-21 MED ORDER — DIAZEPAM 2 MG PO TABS: 2.0000 mg | ORAL_TABLET | Freq: Three times a day (TID) | ORAL | Status: DC | PRN

## 2015-02-21 NOTE — Discharge Instructions (Signed)
Torticollis, Acute You have suddenly (acutely) developed a twisted neck (torticollis). This is usually a self-limited condition. CAUSES  Acute torticollis may be caused by malposition, trauma or infection. Most commonly, acute torticollis is caused by sleeping in an awkward position. Torticollis may also be caused by the flexion, extension or twisting of the neck muscles beyond their normal position. Sometimes, the exact cause may not be known. SYMPTOMS  Usually, there is pain and limited movement of the neck. Your neck may twist to one side. DIAGNOSIS  The diagnosis is often made by physical examination. X-rays, CT scans or MRIs may be done if there is a history of trauma or concern of infection. TREATMENT  For a common, stiff neck that develops during sleep, treatment is focused on relaxing the contracted neck muscle. Medications (including shots) may be used to treat the problem. Most cases resolve in several days. Torticollis usually responds to conservative physical therapy. If left untreated, the shortened and spastic neck muscle can cause deformities in the face and neck. Rarely, surgery is required. HOME CARE INSTRUCTIONS   Use over-the-counter and prescription medications as directed by your caregiver.  Do stretching exercises and massage the neck as directed by your caregiver.  Follow up with physical therapy if needed and as directed by your caregiver. SEEK IMMEDIATE MEDICAL CARE IF:   You develop difficulty breathing or noisy breathing (stridor).  You drool, develop trouble swallowing or have pain with swallowing.  You develop numbness or weakness in the hands or feet.  You have changes in speech or vision.  You have problems with urination or bowel movements.  You have difficulty walking.  You have a fever.  You have increased pain. MAKE SURE YOU:   Understand these instructions.  Will watch your condition.  Will get help right away if you are not doing well or  get worse. Document Released: 06/09/2000 Document Revised: 09/04/2011 Document Reviewed: 07/21/2009 [facility] Patient Information 2015 [place], [place]. This information is not intended to replace advice given to you by your health care provider. Make sure you discuss any questions you have with your health care provider.  Your exam is improved from last week. Take the prescription muscle relaxant as directed for muscle pain.  Follow-up with Dr. Lucina Mellow as needed for continued problems.

## 2015-02-21 NOTE — ED Notes (Signed)
Per family he was seen and treated for cough ..states having pain to post shoudler and back or neck from cough

## 2015-02-21 NOTE — ED Notes (Signed)
Developed pain from back of neck and shoulder  conts to have cough .

## 2015-02-22 NOTE — ED Provider Notes (Signed)
[facility] Emergency Department Provider Note ____________________________________________  Time seen: 1440  I have reviewed the triage vital signs and the nursing notes.  HISTORY  Chief Complaint  Shoulder Pain  HPI Kevin Burns is a 81 y.o. male reports to the ED with his adult son for treatment of some increased right-sided neck spasm due to increased, and bilateral coughing. The patient was recently treated for a gritty acquired pneumonia, and was prescribed Levaquin. He has completed the antibiotic as prescribed, but now notes some right-sided neck pain due to ongoing, nonproductive cough. He reports that the cough and congestion has improved since beginning the antibiotic the cough is intermittent at this time and there have been no interim fevers.Patient denies any upper extremities paresthesias, chest pain, or shortness of breath. He is out of any previously prescribed muscle relaxant acquainted his adult son.  Past Medical History  Diagnosis Date  . Seizures   . Hypertension     There are no active problems to display for this patient.   Past Surgical History  Procedure Laterality Date  . Replacement total knee      right    Current Outpatient Rx  Name  Route  Sig  Dispense  Refill  . albuterol (PROVENTIL HFA;VENTOLIN HFA) 108 (90 BASE) MCG/ACT inhaler   Inhalation   Inhale 2 puffs into the lungs every 6 (six) hours as needed for wheezing or shortness of breath.   1 Inhaler   2   . amLODipine (NORVASC) 10 MG tablet   Oral   Take 1 tablet by mouth daily.         Marland Kitchen atorvastatin (LIPITOR) 80 MG tablet   Oral   Take 1 tablet by mouth daily.         . benzonatate (TESSALON PERLES) 100 MG capsule   Oral   Take 1 capsule (100 mg total) by mouth 3 (three) times daily as needed for cough (Take 1-2 per dose).   30 capsule   0   . Chlorpheniramine-Codeine 2-8 MG/5ML LIQD   Oral   Take 5 mLs by mouth every 4 (four) hours as needed.   100  mL   0   . clopidogrel (PLAVIX) 75 MG tablet   Oral   Take 1 tablet by mouth daily.         . diazepam (VALIUM) 2 MG tablet   Oral   Take 1 tablet (2 mg total) by mouth every 8 (eight) hours as needed for muscle spasms.   10 tablet   0   . fluocinolone (VANOS) 0.01 % cream   Topical   Apply 1 application topically as needed.         . levETIRAcetam (KEPPRA) 500 MG tablet   Oral   Take 5 tablets by mouth daily.         Marland Kitchen levofloxacin (LEVAQUIN) 750 MG tablet   Oral   Take 1 tablet (750 mg total) by mouth daily.   7 tablet   0   . levothyroxine (SYNTHROID, LEVOTHROID) 100 MCG tablet   Oral   Take 1 tablet by mouth daily.         . metoprolol succinate (TOPROL-XL) 100 MG 24 hr tablet   Oral   Take 1.5 tablets by mouth daily.         Marland Kitchen omeprazole (PRILOSEC) 40 MG capsule   Oral   Take 1 capsule by mouth daily.         . phenytoin (DILANTIN)  100 MG ER capsule   Oral   Take 3 capsules by mouth daily.         . tamsulosin (FLOMAX) 0.4 MG CAPS capsule   Oral   Take 1 capsule by mouth daily.         Marland Kitchen triamterene-hydrochlorothiazide (MAXZIDE-25) 37.5-25 MG per tablet   Oral   Take 1 tablet by mouth daily.         Marland Kitchen VASCEPA 1 G CAPS   Oral   Take 1 tablet by mouth daily.           Dispense as written.    Allergies Review of patient's allergies indicates no known allergies.  No family history on file.  Social History Social History  Substance Use Topics  . Smoking status: Never Smoker   . Smokeless tobacco: None  . Alcohol Use: No   Review of Systems  Constitutional: Negative for fever. Eyes: Negative for visual changes. ENT: Negative for sore throat. Cardiovascular: Negative for chest pain. Respiratory: Negative for shortness of breath. Gastrointestinal: Negative for abdominal pain, vomiting and diarrhea. Genitourinary: Negative for dysuria. Musculoskeletal: Negative for back pain. Right neck pain. Skin: Negative for  rash. Neurological: Negative for headaches, focal weakness or numbness. ____________________________________________  PHYSICAL EXAM:  VITAL SIGNS: ED Triage Vitals  Enc Vitals Group     BP 02/21/15 1330 129/68 mmHg     Pulse Rate 02/21/15 1330 66     Resp 02/21/15 1330 20     Temp 02/21/15 1330 98.7 F (37.1 C)     Temp Source 02/21/15 1330 Oral     SpO2 02/21/15 1330 96 %     Weight 02/21/15 1330 213 lb (96.616 kg)     Height 02/21/15 1330  (1.676 m)     Head Cir --      Peak Flow --      Pain Score 02/21/15 1331 10     Pain Loc --      Pain Edu? --      Excl. in GC? --    Constitutional: Alert and oriented. Well appearing and in no distress. Eyes: Conjunctivae are normal. PERRL. Normal extraocular movements. ENT   Head: Normocephalic and atraumatic.   Nose: No congestion/rhinnorhea.   Mouth/Throat: Mucous membranes are moist.   Neck: Supple. No thyromegaly. Hematological/Lymphatic/Immunilogical: No cervical lymphadenopathy. Cardiovascular: Normal rate, regular rhythm.  Respiratory: Normal respiratory effort. No wheezes/rales/rhonchi. Gastrointestinal: Soft and nontender. No distention. Musculoskeletal: Normal spinal alignment without deformity. Decreased right neck rotation due to pain. Nontender with normal range of motion in all extremities.  Neurologic:  Normal gait without ataxia. Normal speech and language. No gross focal neurologic deficits are appreciated. Skin:  Skin is warm, dry and intact. No rash noted. Psychiatric: Mood and affect are normal. Patient exhibits appropriate insight and judgment. ____________________________________________  PROCEDURES  Valium 2.5 mg IM ____________________________________________  INITIAL IMPRESSION / ASSESSMENT AND PLAN / ED COURSE  Acute tortocollis due to bronchospasm. Will treat with Valium 2 mg #10 and Tessalon Perles for recent pneumonia. Follow-up with primary provider at [facility] as needed.   ____________________________________________  FINAL CLINICAL IMPRESSION(S) / ED DIAGNOSES  Final diagnoses:  Torticollis, acute     Lissa Hoard, PA-C 02/22/15 1610  Minna Antis, MD 02/24/15 1501

## 2015-07-12 ENCOUNTER — Encounter: Payer: Self-pay | Admitting: *Deleted

## 2015-07-12 ENCOUNTER — Emergency Department
Admission: EM | Admit: 2015-07-12 | Discharge: 2015-07-12 | Disposition: A | Discharge status: Home / Self Care | Payer: Medicare Other | Attending: Emergency Medicine | Admitting: Emergency Medicine

## 2015-07-12 DIAGNOSIS — H578 Other specified disorders of eye and adnexa: Secondary | ICD-10-CM | POA: Diagnosis present

## 2015-07-12 DIAGNOSIS — Z7902 Long term (current) use of antithrombotics/antiplatelets: Secondary | ICD-10-CM | POA: Diagnosis not present

## 2015-07-12 DIAGNOSIS — I10 Essential (primary) hypertension: Secondary | ICD-10-CM | POA: Insufficient documentation

## 2015-07-12 DIAGNOSIS — H01001 Unspecified blepharitis right upper eyelid: Secondary | ICD-10-CM | POA: Insufficient documentation

## 2015-07-12 DIAGNOSIS — Z792 Long term (current) use of antibiotics: Secondary | ICD-10-CM | POA: Insufficient documentation

## 2015-07-12 DIAGNOSIS — Z79899 Other long term (current) drug therapy: Secondary | ICD-10-CM | POA: Insufficient documentation

## 2015-07-12 MED ORDER — KETOROLAC TROMETHAMINE 0.5 % OP SOLN: 1.0000 [drp] | Freq: Four times a day (QID) | OPHTHALMIC | Status: DC

## 2015-07-12 NOTE — ED Notes (Signed)
Pt complains of right eye pain with a small amount of swelling, pt denies any other symptoms

## 2015-07-12 NOTE — ED Provider Notes (Signed)
[facility] Emergency Department Provider Note  ____________________________________________  Time seen: Approximately 2:32 PM  I have reviewed the triage vital signs and the nursing notes.   HISTORY  Chief Complaint Eye Problem    HPI Kevin Burns is a 82 y.o. male who presents emergency department complaining of right eye swelling 4-5 days. He also endorses some "itching and irritation" to right eye. Patient states that he has been been just dealing with symptoms at home until this morning he looked in the mirror and saw a "pimple" on his right upper eyelid. Patient denies any visual acuity changes, he denies any ocular pain, he denies any drainage from eye. Patient has not tried any medications at home prior to arrival. No other symptoms or complaints at this time.   Past Medical History  Diagnosis Date  . Seizures (HCC)   . Hypertension     There are no active problems to display for this patient.   Past Surgical History  Procedure Laterality Date  . Replacement total knee      right    Current Outpatient Rx  Name  Route  Sig  Dispense  Refill  . albuterol (PROVENTIL HFA;VENTOLIN HFA) 108 (90 BASE) MCG/ACT inhaler   Inhalation   Inhale 2 puffs into the lungs every 6 (six) hours as needed for wheezing or shortness of breath.   1 Inhaler   2   . amLODipine (NORVASC) 10 MG tablet   Oral   Take 1 tablet by mouth daily.         Marland Kitchen. atorvastatin (LIPITOR) 80 MG tablet   Oral   Take 1 tablet by mouth daily.         . benzonatate (TESSALON PERLES) 100 MG capsule   Oral   Take 1 capsule (100 mg total) by mouth 3 (three) times daily as needed for cough (Take 1-2 per dose).   30 capsule   0   . Chlorpheniramine-Codeine 2-8 MG/5ML LIQD   Oral   Take 5 mLs by mouth every 4 (four) hours as needed.   100 mL   0   . clopidogrel (PLAVIX) 75 MG tablet   Oral   Take 1 tablet by mouth daily.         . diazepam (VALIUM) 2 MG tablet   Oral    Take 1 tablet (2 mg total) by mouth every 8 (eight) hours as needed for muscle spasms.   10 tablet   0   . fluocinolone (VANOS) 0.01 % cream   Topical   Apply 1 application topically as needed.         Marland Kitchen. ketorolac (ACULAR) 0.5 % ophthalmic solution   Right Eye   Place 1 drop into the right eye 4 (four) times daily.   5 mL   0   . levETIRAcetam (KEPPRA) 500 MG tablet   Oral   Take 5 tablets by mouth daily.         Marland Kitchen. levofloxacin (LEVAQUIN) 750 MG tablet   Oral   Take 1 tablet (750 mg total) by mouth daily.   7 tablet   0   . levothyroxine (SYNTHROID, LEVOTHROID) 100 MCG tablet   Oral   Take 1 tablet by mouth daily.         . metoprolol succinate (TOPROL-XL) 100 MG 24 hr tablet   Oral   Take 1.5 tablets by mouth daily.         Marland Kitchen. omeprazole (PRILOSEC) 40 MG capsule  Oral   Take 1 capsule by mouth daily.         . phenytoin (DILANTIN) 100 MG ER capsule   Oral   Take 3 capsules by mouth daily.         . tamsulosin (FLOMAX) 0.4 MG CAPS capsule   Oral   Take 1 capsule by mouth daily.         Marland Kitchen triamterene-hydrochlorothiazide (MAXZIDE-25) 37.5-25 MG per tablet   Oral   Take 1 tablet by mouth daily.         Marland Kitchen VASCEPA 1 G CAPS   Oral   Take 1 tablet by mouth daily.           Dispense as written.     Allergies Review of patient's allergies indicates no known allergies.  No family history on file.  Social History Social History  Substance Use Topics  . Smoking status: Never Smoker   . Smokeless tobacco: None  . Alcohol Use: No     Review of Systems  Constitutional: No fever/chills Eyes: No visual changes. No discharge ENT: No sore throat. Endorses swelling to the right upper eyelid with a "pimple". Cardiovascular: no chest pain. Respiratory: no cough. No SOB. Gastrointestinal: No abdominal pain.  No nausea, no vomiting.  No diarrhea.  No constipation. Genitourinary: Negative for dysuria. No hematuria Musculoskeletal: Negative for  back pain. Skin: Negative for rash. Neurological: Negative for headaches, focal weakness or numbness. 10-point ROS otherwise negative.  ____________________________________________   PHYSICAL EXAM:  VITAL SIGNS: ED Triage Vitals  Enc Vitals Group     BP 07/12/15 1317 147/74 mmHg     Pulse Rate 07/12/15 1317 52     Resp 07/12/15 1317 20     Temp 07/12/15 1317 97.7 F (36.5 C)     Temp Source 07/12/15 1317 Oral     SpO2 07/12/15 1317 95 %     Weight 07/12/15 1317 231 lb (104.781 kg)     Height 07/12/15 1317 5\' 6"  (1.676 m)     Head Cir --      Peak Flow --      Pain Score --      Pain Loc --      Pain Edu? --      Excl. in GC? --      Constitutional: Alert and oriented. Well appearing and in no acute distress. Eyes: Conjunctivae are normal. No drainage noted. PERRL. EOMI. edema noted to right upper eyelid. Firm nodule palpated right upper eyelid in the medial aspect. Head: Atraumatic. ENT:      Ears:       Nose: No congestion/rhinnorhea.      Mouth/Throat: Mucous membranes are moist.  Neck: No stridor.   Hematological/Lymphatic/Immunilogical: No cervical lymphadenopathy. Cardiovascular: Normal rate, regular rhythm. Normal S1 and S2.  Good peripheral circulation. Respiratory: Normal respiratory effort without tachypnea or retractions. Lungs CTAB. Gastrointestinal: Soft and nontender. No distention. No CVA tenderness. Musculoskeletal: No lower extremity tenderness nor edema.  No joint effusions. Neurologic:  Normal speech and language. No gross focal neurologic deficits are appreciated.  Skin:  Skin is warm, dry and intact. No rash noted. Psychiatric: Mood and affect are normal. Speech and behavior are normal. Patient exhibits appropriate insight and judgement.   ____________________________________________   LABS (all labs ordered are listed, but only abnormal results are displayed)  Labs Reviewed - No data to  display ____________________________________________  EKG   ____________________________________________  RADIOLOGY   No results found.  ____________________________________________  PROCEDURES  Procedure(s) performed:       Medications - No data to display   ____________________________________________   INITIAL IMPRESSION / ASSESSMENT AND PLAN / ED COURSE  Pertinent labs & imaging results that were available during my care of the patient were reviewed by me and considered in my medical decision making (see chart for details).  Patient's diagnosis is consistent with blepharitis to the right upper eyelid. Patient will be discharged home with prescriptions for anti-inflammatory eyedrops and instructed to use warm hot compresses to the right eye. Patient is to follow up with ophthalmology if symptoms persist past this treatment course. Patient is given ED precautions to return to the ED for any worsening or new symptoms.     ____________________________________________  FINAL CLINICAL IMPRESSION(S) / ED DIAGNOSES  Final diagnoses:  Blepharitis of right upper eyelid      NEW MEDICATIONS STARTED DURING THIS VISIT:  Discharge Medication List as of 07/12/2015  2:41 PM    START taking these medications   Details  ketorolac (ACULAR) 0.5 % ophthalmic solution Place 1 drop into the right eye 4 (four) times daily., Starting 07/12/2015, Until Discontinued, Print           Racheal Patches, PA-C 07/12/15 1532  Jennye Moccasin, MD 07/12/15 [phone]

## 2015-07-12 NOTE — Discharge Instructions (Signed)
Blepharitis Blepharitis is inflammation of the eyelids. Blepharitis may happen with:  Reddish, scaly skin around the scalp and eyebrows.  Burning or itching of the eyelids.  Eye discharge at night that causes the eyelashes to stick together in the morning.  Eyelashes that fall out.  Sensitivity to light. HOME CARE INSTRUCTIONS Pay attention to any changes in how you look or feel. Follow these instructions to help with your condition: Keeping Clean  Wash your hands often.  Wash your eyelids with warm water or with warm water that is mixed with a small amount of baby shampoo. Do this two times per day or as often as needed.  Wash your face and eyebrows at least once a day.  Use a clean towel each time you dry your eyelids. Do not use this towel to clean or dry other areas of your body. Do not share your towel with anyone. General Instructions  Avoid wearing makeup until you get better. Do not share makeup with anyone.  Avoid rubbing your eyes.  Apply warm compresses to your eyes 2 times per day for 10 minutes at a time, or as told by your health care provider.  If you were prescribed an antibiotic ointment or steroid drops, apply or use the medicine as told by your health care provider. Do not stop using the medicine even if you feel better.  Keep all follow-up visits as told by your health care provider. This is important. SEEK MEDICAL CARE IF:  Your eyelids feel hot.  You have blisters or a rash on your eyelids.  The condition does not go away in 2-4 days.  The inflammation gets worse. SEEK IMMEDIATE MEDICAL CARE IF:  You have pain or redness that gets worse or spreads to other parts of your face.  Your vision changes.  You have pain when looking at lights or moving objects.  You have a fever.   This information is not intended to replace advice given to you by your health care provider. Make sure you discuss any questions you have with your health care  provider.   Document Released: 06/09/2000 Document Revised: 03/03/2015 Document Reviewed: 10/05/2014 [employer] Interactive Patient Education 2016 [employer]  

## 2015-12-06 ENCOUNTER — Emergency Department
Admission: EM | Admit: 2015-12-06 | Discharge: 2015-12-06 | Disposition: A | Discharge status: Home / Self Care | Payer: Medicare Other | Attending: Emergency Medicine | Admitting: Emergency Medicine

## 2015-12-06 ENCOUNTER — Emergency Department: Payer: Medicare Other

## 2015-12-06 ENCOUNTER — Encounter: Payer: Self-pay | Admitting: Emergency Medicine

## 2015-12-06 DIAGNOSIS — Y939 Activity, unspecified: Secondary | ICD-10-CM | POA: Diagnosis not present

## 2015-12-06 DIAGNOSIS — W010XXA Fall on same level from slipping, tripping and stumbling without subsequent striking against object, initial encounter: Secondary | ICD-10-CM | POA: Insufficient documentation

## 2015-12-06 DIAGNOSIS — S20229A Contusion of unspecified back wall of thorax, initial encounter: Secondary | ICD-10-CM | POA: Diagnosis not present

## 2015-12-06 DIAGNOSIS — Y999 Unspecified external cause status: Secondary | ICD-10-CM | POA: Insufficient documentation

## 2015-12-06 DIAGNOSIS — G40909 Epilepsy, unspecified, not intractable, without status epilepticus: Secondary | ICD-10-CM | POA: Insufficient documentation

## 2015-12-06 DIAGNOSIS — S40012A Contusion of left shoulder, initial encounter: Secondary | ICD-10-CM | POA: Diagnosis not present

## 2015-12-06 DIAGNOSIS — I10 Essential (primary) hypertension: Secondary | ICD-10-CM | POA: Diagnosis not present

## 2015-12-06 DIAGNOSIS — M25512 Pain in left shoulder: Secondary | ICD-10-CM | POA: Diagnosis present

## 2015-12-06 DIAGNOSIS — Y92010 Kitchen of single-family (private) house as the place of occurrence of the external cause: Secondary | ICD-10-CM | POA: Diagnosis not present

## 2015-12-06 DIAGNOSIS — Z79899 Other long term (current) drug therapy: Secondary | ICD-10-CM | POA: Insufficient documentation

## 2015-12-06 MED ORDER — MELOXICAM 15 MG PO TABS: 15.0000 mg | ORAL_TABLET | Freq: Every day | ORAL | Status: DC

## 2015-12-06 MED ORDER — KETOROLAC TROMETHAMINE 30 MG/ML IJ SOLN
30.0000 mg | Freq: Once | INTRAMUSCULAR | Status: AC
  Administered 2015-12-06: 30 mg via INTRAMUSCULAR
  Filled 2015-12-06: qty 1

## 2015-12-06 MED ORDER — DIAZEPAM 2 MG PO TABS: 2.0000 mg | ORAL_TABLET | Freq: Three times a day (TID) | ORAL | Status: AC | PRN

## 2015-12-06 NOTE — ED Notes (Signed)
States he fell in the kitchen yesterday  Having pain to lower back and left shoulder  Was able to get himself up after falling  No deformity noted

## 2015-12-06 NOTE — Discharge Instructions (Signed)
Cryotherapy °Cryotherapy means treatment with cold. Ice or gel packs can be used to reduce both pain and swelling. Ice is the most helpful within the first 24 to 48 hours after an injury or flare-up from overusing a muscle or joint. Sprains, strains, spasms, burning pain, shooting pain, and aches can all be eased with ice. Ice can also be used when recovering from surgery. Ice is effective, has very few side effects, and is safe for most people to use. °PRECAUTIONS  °Ice is not a safe treatment option for people with: °· Raynaud phenomenon. This is a condition affecting small blood vessels in the extremities. Exposure to cold may cause your problems to return. °· Cold hypersensitivity. There are many forms of cold hypersensitivity, including: °· Cold urticaria. Red, itchy hives appear on the skin when the tissues begin to warm after being iced. °· Cold erythema. This is a red, itchy rash caused by exposure to cold. °· Cold hemoglobinuria. Red blood cells break down when the tissues begin to warm after being iced. The hemoglobin that carry oxygen are passed into the urine because they cannot combine with blood proteins fast enough. °· Numbness or altered sensitivity in the area being iced. °If you have any of the following conditions, do not use ice until you have discussed cryotherapy with your caregiver: °· Heart conditions, such as arrhythmia, angina, or chronic heart disease. °· High blood pressure. °· Healing wounds or open skin in the area being iced. °· Current infections. °· Rheumatoid arthritis. °· Poor circulation. °· Diabetes. °Ice slows the blood flow in the region it is applied. This is beneficial when trying to stop inflamed tissues from spreading irritating chemicals to surrounding tissues. However, if you expose your skin to cold temperatures for too long or without the proper protection, you can damage your skin or nerves. Watch for signs of skin damage due to cold. °HOME CARE INSTRUCTIONS °Follow  these tips to use ice and cold packs safely. °· Place a dry or damp towel between the ice and skin. A damp towel will cool the skin more quickly, so you may need to shorten the time that the ice is used. °· For a more rapid response, add gentle compression to the ice. °· Ice for no more than 10 to 20 minutes at a time. The bonier the area you are icing, the less time it will take to get the benefits of ice. °· Check your skin after 5 minutes to make sure there are no signs of a poor response to cold or skin damage. °· Rest 20 minutes or more between uses. °· Once your skin is numb, you can end your treatment. You can test numbness by very lightly touching your skin. The touch should be so light that you do not see the skin dimple from the pressure of your fingertip. When using ice, most people will feel these normal sensations in this order: cold, burning, aching, and numbness. °· Do not use ice on someone who cannot communicate their responses to pain, such as small children or people with dementia. °HOW TO MAKE AN ICE PACK °Ice packs are the most common way to use ice therapy. Other methods include ice massage, ice baths, and cryosprays. Muscle creams that cause a cold, tingly feeling do not offer the same benefits that ice offers and should not be used as a substitute unless recommended by your caregiver. °To make an ice pack, do one of the following: °· Place crushed ice or a   bag of frozen vegetables in a sealable plastic bag. Squeeze out the excess air. Place this bag inside another plastic bag. Slide the bag into a pillowcase or place a damp towel between your skin and the bag.  Mix 3 parts water with 1 part rubbing alcohol. Freeze the mixture in a sealable plastic bag. When you remove the mixture from the freezer, it will be slushy. Squeeze out the excess air. Place this bag inside another plastic bag. Slide the bag into a pillowcase or place a damp towel between your skin and the bag. SEEK MEDICAL CARE  IF:  You develop white spots on your skin. This may give the skin a blotchy (mottled) appearance.  Your skin turns blue or pale.  Your skin becomes waxy or hard.  Your swelling gets worse. MAKE SURE YOU:   Understand these instructions.  Will watch your condition.  Will get help right away if you are not doing well or get worse.   This information is not intended to replace advice given to you by your health care provider. Make sure you discuss any questions you have with your health care provider.   Document Released: 02/06/2011 Document Revised: 07/03/2014 Document Reviewed: 02/06/2011 Elsevier Interactive Patient Education 2016 [place] A contusion is a deep bruise. Contusions are the result of a blunt injury to tissues and muscle fibers under the skin. The injury causes bleeding under the skin. The skin overlying the contusion may turn blue, purple, or yellow. Minor injuries will give you a painless contusion, but more severe contusions may stay painful and swollen for a few weeks.  CAUSES  This condition is usually caused by a blow, trauma, or direct force to an area of the body. SYMPTOMS  Symptoms of this condition include:  Swelling of the injured area.  Pain and tenderness in the injured area.  Discoloration. The area may have redness and then turn blue, purple, or yellow. DIAGNOSIS  This condition is diagnosed based on a physical exam and medical history. An X-ray, CT scan, or MRI may be needed to determine if there are any associated injuries, such as broken bones (fractures). TREATMENT  Specific treatment for this condition depends on what area of the body was injured. In general, the best treatment for a contusion is resting, icing, applying pressure to (compression), and elevating the injured area. This is often called the RICE strategy. Over-the-counter anti-inflammatory medicines may also be recommended for pain control.  HOME CARE INSTRUCTIONS     Rest the injured area.  If directed, apply ice to the injured area:  Put ice in a plastic bag.  Place a towel between your skin and the bag.  Leave the ice on for 20 minutes, 2-3 times per day.  If directed, apply light compression to the injured area using an elastic bandage. Make sure the bandage is not wrapped too tightly. Remove and reapply the bandage as directed by your health care provider.  If possible, raise (elevate) the injured area above the level of your heart while you are sitting or lying down.  Take over-the-counter and prescription medicines only as told by your health care provider. SEEK MEDICAL CARE IF:  Your symptoms do not improve after several days of treatment.  Your symptoms get worse.  You have difficulty moving the injured area. SEEK IMMEDIATE MEDICAL CARE IF:   You have severe pain.  You have numbness in a hand or foot.  Your hand or foot turns pale or cold.  This information is not intended to replace advice given to you by your health care provider. Make sure you discuss any questions you have with your health care provider.   Document Released: 03/22/2005 Document Revised: 03/03/2015 Document Reviewed: 10/28/2014 Elsevier Interactive Patient Education 2016 Elsevier Inc[employer]  Cryotherapy Cryotherapy is when you put ice on your injury. Ice helps lessen pain and puffiness (swelling) after an injury. Ice works the best when you start using it in the first 24 to 48 hours after an injury. HOME CARE  Put a dry or damp towel between the ice pack and your skin.  You may press gently on the ice pack.  Leave the ice on for no more than 10 to 20 minutes at a time.  Check your skin after 5 minutes to make sure your skin is okay.  Rest at least 20 minutes between ice pack uses.  Stop using ice when your skin loses feeling (numbness).  Do not use ice on someone who cannot tell you when it hurts. This includes small children and people with memory  problems (dementia). GET HELP RIGHT AWAY IF:  You have white spots on your skin.  Your skin turns blue or pale.  Your skin feels waxy or hard.  Your puffiness gets worse. MAKE SURE YOU:   Understand these instructions.  Will watch your condition.  Will get help right away if you are not doing well or get worse.   This information is not intended to replace advice given to you by your health care provider. Make sure you discuss any questions you have with your health care provider.   Document Released: 11/29/2007 Document Revised: 09/04/2011 Document Reviewed: 02/02/2011 Elsevier Interactive Patient Education [employer] Inc[employer]

## 2015-12-06 NOTE — ED Notes (Signed)
Pt slipped and fell on wet kitchen floor yesterday at home and now has low back pain. No loc.

## 2015-12-06 NOTE — ED Provider Notes (Signed)
[facility] Emergency Department Provider Note  ____________________________________________  Time seen: Approximately 10:37 AM  I have reviewed the triage vital signs and the nursing notes.   HISTORY  Chief Complaint Back Pain    HPI Kevin Burns is a 82 y.o. male presents for evaluation of lower middle back pain and left shoulder pain. Past medical history left rotator cuff tear. Patient states he was in the kitchen yesterday when he slipped and fell landing on his back. Bruising noted to the mid spine.   Past Medical History  Diagnosis Date  . Seizures (HCC)   . Hypertension     There are no active problems to display for this patient.   Past Surgical History  Procedure Laterality Date  . Replacement total knee      right    Current Outpatient Rx  Name  Route  Sig  Dispense  Refill  . albuterol (PROVENTIL HFA;VENTOLIN HFA) 108 (90 BASE) MCG/ACT inhaler   Inhalation   Inhale 2 puffs into the lungs every 6 (six) hours as needed for wheezing or shortness of breath.   1 Inhaler   2   . amLODipine (NORVASC) 10 MG tablet   Oral   Take 1 tablet by mouth daily.         Marland Kitchen atorvastatin (LIPITOR) 80 MG tablet   Oral   Take 1 tablet by mouth daily.         . clopidogrel (PLAVIX) 75 MG tablet   Oral   Take 1 tablet by mouth daily.         . diazepam (VALIUM) 2 MG tablet   Oral   Take 1 tablet (2 mg total) by mouth every 8 (eight) hours as needed for anxiety.   21 tablet   0   . fluocinolone (VANOS) 0.01 % cream   Topical   Apply 1 application topically as needed.         Marland Kitchen ketorolac (ACULAR) 0.5 % ophthalmic solution   Right Eye   Place 1 drop into the right eye 4 (four) times daily.   5 mL   0   . levETIRAcetam (KEPPRA) 500 MG tablet   Oral   Take 5 tablets by mouth daily.         Marland Kitchen levothyroxine (SYNTHROID, LEVOTHROID) 100 MCG tablet   Oral   Take 1 tablet by mouth daily.         . meloxicam (MOBIC) 15 MG  tablet   Oral   Take 1 tablet (15 mg total) by mouth daily.   30 tablet   0   . metoprolol succinate (TOPROL-XL) 100 MG 24 hr tablet   Oral   Take 1.5 tablets by mouth daily.         Marland Kitchen omeprazole (PRILOSEC) 40 MG capsule   Oral   Take 1 capsule by mouth daily.         . phenytoin (DILANTIN) 100 MG ER capsule   Oral   Take 3 capsules by mouth daily.         . tamsulosin (FLOMAX) 0.4 MG CAPS capsule   Oral   Take 1 capsule by mouth daily.         Marland Kitchen triamterene-hydrochlorothiazide (MAXZIDE-25) 37.5-25 MG per tablet   Oral   Take 1 tablet by mouth daily.         Marland Kitchen VASCEPA 1 G CAPS   Oral   Take 1 tablet by mouth daily.  Dispense as written.     Allergies Review of patient's allergies indicates no known allergies.  No family history on file.  Social History Social History  Substance Use Topics  . Smoking status: Never Smoker   . Smokeless tobacco: None  . Alcohol Use: No    Review of Systems Constitutional: No fever/chills Cardiovascular: Denies chest pain. Respiratory: Denies shortness of breath. Musculoskeletal: Positive for back pain. Positive for left shoulder pain. Skin: Negative for rash. Neurological: Negative for headaches, focal weakness or numbness.  10-point ROS otherwise negative.  ____________________________________________   PHYSICAL EXAM:  VITAL SIGNS: ED Triage Vitals  Enc Vitals Group     BP 12/06/15 0957 149/81 mmHg     Pulse Rate 12/06/15 0957 56     Resp 12/06/15 0957 20     Temp 12/06/15 0957 98.2 F (36.8 C)     Temp Source 12/06/15 0957 Oral     SpO2 12/06/15 0957 96 %     Weight 12/06/15 0957 235 lb (106.595 kg)     Height 12/06/15 0957 5\' 6"  (1.676 m)     Head Cir --      Peak Flow --      Pain Score 12/06/15 0957 9     Pain Loc --      Pain Edu? --      Excl. in GC? --     Constitutional: Alert and oriented. Well appearing and in no acute distress. Neck: No stridor. Full range of motion  nontender.  Cardiovascular: Normal rate, regular rhythm. Grossly normal heart sounds.  Good peripheral circulation. Respiratory: Normal respiratory effort.  No retractions. Lungs CTAB. Gastrointestinal: Soft and nontender. No distention. No abdominal bruits. No CVA tenderness. Musculoskeletal: Superficial abrasion noted to the middle of the spine. Palpation was unremarkable for any bony tenderness. Positive paraspinal tenderness. Left shoulder with limited range of motion increased pain with extension and abduction. Neurologic:  Normal speech and language. No gross focal neurologic deficits are appreciated. No gait instability. Skin:  Skin is warm, dry and intact. No rash noted. Psychiatric: Mood and affect are normal. Speech and behavior are normal.  ____________________________________________   LABS (all labs ordered are listed, but only abnormal results are displayed)  Labs Reviewed - No data to display ____________________________________________   RADIOLOGY IMPRESSION: Osteoarthritic change at multiple levels. No fracture or spondylolisthesis.  ____________________________________________   PROCEDURES  Procedure(s) performed: None  Critical Care performed: No  ____________________________________________   INITIAL IMPRESSION / ASSESSMENT AND PLAN / ED COURSE  Pertinent labs & imaging results that were available during my care of the patient were reviewed by me and considered in my medical decision making (see chart for details).  Status post fall with acute left shoulder strain and thoracic contusion. Reassurance provided to the patient were placed in a sling for comfort Toradol 30 mg IM given while in the ED discharged home with meloxicam 15 mg daily and baclofen 5 mg 3 times a day. ____________________________________________   FINAL CLINICAL IMPRESSION(S) / ED DIAGNOSES  Final diagnoses:  Shoulder contusion, left, initial encounter  Back contusion, unspecified  laterality, initial encounter     This chart was dictated using voice recognition software/Dragon. Despite best efforts to proofread, errors can occur which can change the meaning. Any change was purely unintentional.   Evangeline Dakinharles M Oreste Majeed, PA-C 12/06/15 1143  Jennye MoccasinBrian S Quigley, MD 12/06/15 [phone]

## 2016-04-02 ENCOUNTER — Emergency Department
Admission: EM | Admit: 2016-04-02 | Discharge: 2016-04-02 | Disposition: A | Discharge status: Home / Self Care | Payer: Medicare Other | Attending: Emergency Medicine | Admitting: Emergency Medicine

## 2016-04-02 ENCOUNTER — Emergency Department: Payer: Medicare Other

## 2016-04-02 ENCOUNTER — Encounter: Payer: Self-pay | Admitting: *Deleted

## 2016-04-02 DIAGNOSIS — R0789 Other chest pain: Secondary | ICD-10-CM | POA: Diagnosis present

## 2016-04-02 DIAGNOSIS — R079 Chest pain, unspecified: Secondary | ICD-10-CM

## 2016-04-02 DIAGNOSIS — E876 Hypokalemia: Secondary | ICD-10-CM | POA: Diagnosis not present

## 2016-04-02 DIAGNOSIS — Z791 Long term (current) use of non-steroidal anti-inflammatories (NSAID): Secondary | ICD-10-CM | POA: Diagnosis not present

## 2016-04-02 DIAGNOSIS — Z79899 Other long term (current) drug therapy: Secondary | ICD-10-CM | POA: Diagnosis not present

## 2016-04-02 DIAGNOSIS — I10 Essential (primary) hypertension: Secondary | ICD-10-CM | POA: Insufficient documentation

## 2016-04-02 LAB — CBC
HEMATOCRIT: 40.3 % (ref 40.0–52.0)
HEMOGLOBIN: 13.8 g/dL (ref 13.0–18.0)
MCH: 32.3 pg (ref 26.0–34.0)
MCHC: 34.3 g/dL (ref 32.0–36.0)
MCV: 94.4 fL (ref 80.0–100.0)
Platelets: 167 10*3/uL (ref 150–440)
RBC: 4.27 MIL/uL — AB (ref 4.40–5.90)
RDW: 13.8 % (ref 11.5–14.5)
WBC: 5.8 10*3/uL (ref 3.8–10.6)

## 2016-04-02 LAB — BASIC METABOLIC PANEL
ANION GAP: 8 (ref 5–15)
BUN: 16 mg/dL (ref 6–20)
CO2: 28 mmol/L (ref 22–32)
Calcium: 9.2 mg/dL (ref 8.9–10.3)
Chloride: 99 mmol/L — ABNORMAL LOW (ref 101–111)
Creatinine, Ser: 1.16 mg/dL (ref 0.61–1.24)
GFR, EST NON AFRICAN AMERICAN: 57 mL/min — AB (ref >60)
GLUCOSE: 143 mg/dL — AB (ref 65–99)
POTASSIUM: 3.2 mmol/L — AB (ref 3.5–5.1)
Sodium: 135 mmol/L (ref 135–145)

## 2016-04-02 LAB — TROPONIN I: Troponin I: 0.03 ng/mL (ref <0.03)

## 2016-04-02 MED ORDER — POTASSIUM CHLORIDE CRYS ER 20 MEQ PO TBCR
40.0000 meq | EXTENDED_RELEASE_TABLET | Freq: Once | ORAL | Status: AC
  Administered 2016-04-02: 40 meq via ORAL
  Filled 2016-04-02: qty 2

## 2016-04-02 NOTE — ED Provider Notes (Signed)
[facility] Emergency Department Provider Note ____________________________________________   I have reviewed the triage vital signs and the triage nursing note.  HISTORY  Chief Complaint Chest Pain   Historian Patient and son at the bedside, they requested to speak [place], declining Spanish interpreter  HPI Kevin Burns is a 82 y.o. male was brought in with his son after experiencing about 20 minutes of left upper chest tightness just before arrival today, within the hour. He called his son and the son states this is unusual for him and wanted to bring him in for evaluation. Patient states that he does have burning after he drinks coffee in the middle of his chest, but the location over to the left side of his chest was unusual. He states that it wasn't exactly pain just kind of up pressure or tightness. He states a little pain on his shoulder. No weakness or numbness. Symptoms are gone now. Never has had exertional chest discomfort. No nausea or sweats or trouble breathing. No recent coughing. No fevers. Denies abdominal pain.  History of a high blood pressure and high cholesterol, and is a nonsmoker.    Past Medical History:  Diagnosis Date  . Hypertension   . Seizures (HCC)     There are no active problems to display for this patient.   Past Surgical History:  Procedure Laterality Date  . REPLACEMENT TOTAL KNEE     right    Prior to Admission medications   Medication Sig Start Date End Date Taking? Authorizing Provider  amLODipine (NORVASC) 10 MG tablet Take 1 tablet by mouth daily. 11/17/14   Historical Provider, MD  atorvastatin (LIPITOR) 80 MG tablet Take 1 tablet by mouth daily. 11/18/14   Historical Provider, MD  clopidogrel (PLAVIX) 75 MG tablet Take 1 tablet by mouth daily. 11/17/14   Historical Provider, MD  diazepam (VALIUM) 2 MG tablet Take 1 tablet (2 mg total) by mouth every 8 (eight) hours as needed for anxiety. 12/06/15 12/05/16  Charmayne Sheer  Beers, PA-C  fluocinolone (VANOS) 0.01 % cream Apply 1 application topically as needed. 02/01/15   Historical Provider, MD  levETIRAcetam (KEPPRA) 500 MG tablet Take 5 tablets by mouth daily. 11/17/14   Historical Provider, MD  levothyroxine (SYNTHROID, LEVOTHROID) 100 MCG tablet Take 1 tablet by mouth daily. 11/17/14   Historical Provider, MD  meloxicam (MOBIC) 15 MG tablet Take 1 tablet (15 mg total) by mouth daily. 12/06/15   Charmayne Sheer Beers, PA-C  metoprolol succinate (TOPROL-XL) 100 MG 24 hr tablet Take 1.5 tablets by mouth daily. 11/17/14   Historical Provider, MD  omeprazole (PRILOSEC) 40 MG capsule Take 1 capsule by mouth daily. 11/19/14   Historical Provider, MD  phenytoin (DILANTIN) 100 MG ER capsule Take 3 capsules by mouth daily. 11/17/14   Historical Provider, MD  tamsulosin (FLOMAX) 0.4 MG CAPS capsule Take 1 capsule by mouth daily. 11/17/14   Historical Provider, MD  triamterene-hydrochlorothiazide (MAXZIDE-25) 37.5-25 MG per tablet Take 1 tablet by mouth daily. 11/19/14   Historical Provider, MD  VASCEPA 1 G CAPS Take 1 tablet by mouth daily. 11/18/14   Historical Provider, MD    No Known Allergies  No family history on file.  Social History Social History  Substance Use Topics  . Smoking status: Never Smoker  . Smokeless tobacco: Never Used  . Alcohol use No    Review of Systems  Constitutional: Negative for fever. Eyes: Negative for visual changes. ENT: Negative for sore throat. Cardiovascular: Negative for palpitations.  Respiratory: Negative for shortness of breath. Gastrointestinal: Negative for abdominal pain, vomiting and diarrhea. Genitourinary: Negative for dysuria. Musculoskeletal: Negative for back pain. Skin: Negative for rash. Neurological: Negative for headache. 10 point Review of Systems otherwise negative ____________________________________________   PHYSICAL EXAM:  VITAL SIGNS: ED Triage Vitals  Enc Vitals Group     BP 04/02/16 1711 (!) 150/69      Pulse Rate 04/02/16 1711 62     Resp 04/02/16 1711 20     Temp 04/02/16 1711 98 F (36.7 C)     Temp Source 04/02/16 1711 Oral     SpO2 04/02/16 1711 96 %     Weight 04/02/16 1712 245 lb (111.1 kg)     Height 04/02/16 1712 5\' 6"  (1.676 m)     Head Circumference --      Peak Flow --      Pain Score 04/02/16 1712 5     Pain Loc --      Pain Edu? --      Excl. in GC? --      Constitutional: Alert and oriented. Well appearing and in no distress. HEENT   Head: Normocephalic and atraumatic.      Eyes: Conjunctivae are normal. PERRL. Normal extraocular movements.      Ears:         Nose: No congestion/rhinnorhea.   Mouth/Throat: Mucous membranes are moist.   Neck: No stridor. Cardiovascular/Chest: Normal rate, regular rhythm.  No murmurs, rubs, or gallops. Respiratory: Normal respiratory effort without tachypnea nor retractions. Breath sounds are clear and equal bilaterally. No wheezes/rales/rhonchi. Gastrointestinal: Soft. No distention, no guarding, no rebound. Obese and somewhat protuberant, but nontender.  Genitourinary/rectal:Deferred Musculoskeletal: Nontender with normal range of motion in all extremities. No joint effusions.  No lower extremity tenderness.  No edema. Neurologic:  Normal speech and language. No gross or focal neurologic deficits are appreciated. Skin:  Skin is warm, dry and intact. No rash noted. Psychiatric: Mood and affect are normal. Speech and behavior are normal. Patient exhibits appropriate insight and judgment.   ____________________________________________  LABS (pertinent positives/negatives)  Labs Reviewed  BASIC METABOLIC PANEL - Abnormal; Notable for the following:       Result Value   Potassium 3.2 (*)    Chloride 99 (*)    Glucose, Bld 143 (*)    GFR calc non Af Amer 57 (*)    All other components within normal limits  CBC - Abnormal; Notable for the following:    RBC 4.27 (*)    All other components within normal limits   TROPONIN I - Abnormal; Notable for the following:    Troponin I 0.03 (*)    All other components within normal limits  TROPONIN I    ____________________________________________    EKG I, Governor Rooksebecca Dody Smartt, MD, the attending physician have personally viewed and interpreted all ECGs.  63 bpm. Normal sinus rhythm. Left axis deviation. Right bundle branch block. Nonspecific ST and T-wave ____________________________________________  RADIOLOGY All Xrays were viewed by me. Imaging interpreted by Radiologist.  Chest x-ray two-view: No active cardiopulmonary disease. __________________________________________  PROCEDURES  Procedure(s) performed: None  Critical Care performed: None  ____________________________________________   ED COURSE / ASSESSMENT AND PLAN  Pertinent labs & imaging results that were available during my care of the patient were reviewed by me and considered in my medical decision making (see chart for details).   Kevin Burns came here after a 20 minute episode of atypical left-sided chest discomfort without really associated symptoms.  He does have a couple of risk factors for coronary artery disease including hypertension and hyperlipidemia lipidemia, but no known coronary artery disease and is a nonsmoker. I discussed with he and his son his reassuring evaluation, but I we will check a repeat troponin after 3 hours. No pulmonary symptoms. No abdominal symptoms. Symptoms are resolved.  I am referring him to follow-up closely with cardiology, and he can call Dr. Lady Gary office in the morning.  Repeat troponin reassuring, patient okay for discharge to night, he understands return precautions.   CONSULTATIONS:   None   Patient / Family / Caregiver informed of clinical course, medical decision-making process, and agree with plan.   I discussed return precautions, follow-up instructions, and discharge instructions with patient and/or  family.   ___________________________________________   FINAL CLINICAL IMPRESSION(S) / ED DIAGNOSES   Final diagnoses:  Hypokalemia  Nonspecific chest pain              Note: This dictation was prepared with Dragon dictation. Any transcriptional errors that result from this process are unintentional    Governor Rooks, MD 04/02/16 2105

## 2016-04-02 NOTE — Discharge Instructions (Signed)
You were evaluated for chest tightness and pressure, and although no certain cause was found, your exam and evaluation are reassuring in the emergency department today.  As we discussed, I do recommend you follow up with a cardiologist, call Dr. Lady GaryFath office tomorrow for appointment in the next 2 days.  Return to the emergency room for any worsening condition including worsening chest pain, trouble breathing, nausea, sweats, passing out, weakness or numbness, or any other symptoms concerning to you.

## 2016-04-02 NOTE — ED Notes (Signed)
Patient transported to X-ray 

## 2016-04-02 NOTE — ED Triage Notes (Signed)
Pt to triage via wheelchair.  Pt states chest tightness for 1 hour.  Pt reports intermittent sob. No diaphoresis.   No n/v/d.  Pt alert.  Speech clear.

## 2016-12-01 ENCOUNTER — Other Ambulatory Visit: Payer: Self-pay | Admitting: Family Medicine

## 2016-12-01 DIAGNOSIS — G519 Disorder of facial nerve, unspecified: Secondary | ICD-10-CM

## 2016-12-04 ENCOUNTER — Ambulatory Visit
Admission: RE | Admit: 2016-12-04 | Discharge: 2016-12-04 | Disposition: A | Discharge status: Home / Self Care | Payer: Medicare Other | Source: Ambulatory Visit | Attending: Family Medicine | Admitting: Family Medicine

## 2016-12-04 DIAGNOSIS — G519 Disorder of facial nerve, unspecified: Secondary | ICD-10-CM | POA: Insufficient documentation

## 2016-12-04 DIAGNOSIS — R9089 Other abnormal findings on diagnostic imaging of central nervous system: Secondary | ICD-10-CM | POA: Insufficient documentation

## 2018-07-27 ENCOUNTER — Other Ambulatory Visit: Payer: Self-pay

## 2018-07-27 ENCOUNTER — Emergency Department
Admission: EM | Admit: 2018-07-27 | Discharge: 2018-07-27 | Disposition: A | Discharge status: Home / Self Care | Payer: Medicare Other | Attending: Emergency Medicine | Admitting: Emergency Medicine

## 2018-07-27 DIAGNOSIS — Z7902 Long term (current) use of antithrombotics/antiplatelets: Secondary | ICD-10-CM | POA: Diagnosis not present

## 2018-07-27 DIAGNOSIS — Z79899 Other long term (current) drug therapy: Secondary | ICD-10-CM | POA: Insufficient documentation

## 2018-07-27 DIAGNOSIS — Z96651 Presence of right artificial knee joint: Secondary | ICD-10-CM | POA: Diagnosis not present

## 2018-07-27 DIAGNOSIS — Z139 Encounter for screening, unspecified: Secondary | ICD-10-CM

## 2018-07-27 DIAGNOSIS — I10 Essential (primary) hypertension: Secondary | ICD-10-CM | POA: Insufficient documentation

## 2018-07-27 LAB — GLUCOSE, CAPILLARY: GLUCOSE-CAPILLARY: 109 mg/dL — AB (ref 70–99)

## 2018-07-27 NOTE — ED Provider Notes (Signed)
[facility] Emergency Department Provider Note  ____________________________________________   First MD Initiated Contact with Patient 07/27/18 [phone]     (approximate)  I have reviewed the triage vital signs and the nursing notes.   HISTORY  Chief Complaint Hypertension    HPI Kevin Burns is a 85 y.o. male   history of hypertension, seizure, and a [employer] who is here for evaluation of high blood pressure  Patient reports he has had no symptoms, but he checks his blood pressure this evening and it was high about 180-190 checks.  Reports he is compliant with his medications, demonstrates his blister packs and appears he has been.  He and his son are both very pleasant.  He had no headache no nausea no vomiting no chest pain no visual changes.  Reports he is in his normal health, just routinely checks his blood pressure at home and noticed it was quite a bit higher than normal tonight.  Denies any other symptoms  Does report that he sees his primary doctor, he had lab work done about a month ago.  The time I saw him both him and his son report he feels fine and need actually just like to be able to go home now, not sure of his blood pressure cuff which is reading too high because here in the ER his blood pressure seems to be a lot better about where he is normally.  Past Medical History:  Diagnosis Date  . Hypertension   . Seizures (HCC)     There are no active problems to display for this patient.   Past Surgical History:  Procedure Laterality Date  . REPLACEMENT TOTAL KNEE     right    Prior to Admission medications   Medication Sig Start Date End Date Taking? Authorizing Provider  amLODipine (NORVASC) 10 MG tablet Take 1 tablet by mouth daily. 11/17/14   [provider]  atorvastatin (LIPITOR) 80 MG tablet Take 1 tablet by mouth daily. 11/18/14   [provider]  clopidogrel (PLAVIX) 75 MG tablet Take 1 tablet by mouth daily.  11/17/14   [provider]  fluocinolone (VANOS) 0.01 % cream Apply 1 application topically as needed. 02/01/15   [provider]  levETIRAcetam (KEPPRA) 500 MG tablet Take 5 tablets by mouth daily. 11/17/14   [provider]  levothyroxine (SYNTHROID, LEVOTHROID) 100 MCG tablet Take 1 tablet by mouth daily. 11/17/14   [provider]  meloxicam (MOBIC) 15 MG tablet Take 1 tablet (15 mg total) by mouth daily. 12/06/15   Beers, Charmayne Sheerharles M, PA-C  metoprolol succinate (TOPROL-XL) 100 MG 24 hr tablet Take 1.5 tablets by mouth daily. 11/17/14   [provider]  omeprazole (PRILOSEC) 40 MG capsule Take 1 capsule by mouth daily. 11/19/14   [provider]  phenytoin (DILANTIN) 100 MG ER capsule Take 3 capsules by mouth daily. 11/17/14   [provider]  tamsulosin (FLOMAX) 0.4 MG CAPS capsule Take 1 capsule by mouth daily. 11/17/14   [provider]  triamterene-hydrochlorothiazide (MAXZIDE-25) 37.5-25 MG per tablet Take 1 tablet by mouth daily. 11/19/14   [provider]  VASCEPA 1 G CAPS Take 1 tablet by mouth daily. 11/18/14   [provider]    Allergies Patient has no known allergies.  No family history on file.  Social History Social History   Tobacco Use  . Smoking status: Never Smoker  . Smokeless tobacco: Never Used  Substance Use Topics  .  Alcohol use: No  . Drug use: Not on file    Review of Systems Constitutional: No fever/chills or recent illness Eyes: No visual changes. Cardiovascular: Denies chest pain. Respiratory: Denies shortness of breath. Gastrointestinal: No abdominal pain.   Musculoskeletal: Negative for back pain. Neurological: Negative for headaches, areas of focal weakness or numbness.    ____________________________________________   PHYSICAL EXAM:  VITAL SIGNS: ED Triage Vitals  Enc Vitals Group     BP 07/27/18 0551 (!) 148/68     Pulse Rate 07/27/18 0551 72     Resp  07/27/18 0551 20     Temp 07/27/18 0551 (!) 97.5 F (36.4 C)     Temp Source 07/27/18 0551 Oral     SpO2 07/27/18 0551 99 %     Weight 07/27/18 0550 213 lb (96.6 kg)     Height 07/27/18 0550 5\' 3"  (1.6 m)     Head Circumference --      Peak Flow --      Pain Score 07/27/18 0550 0     Pain Loc --      Pain Edu? --      Excl. in GC? --     Constitutional: Alert and oriented. Well appearing and in no acute distress.  Patient and his son are both extremely pleasant. Eyes: Conjunctivae are normal. Head: Atraumatic. Nose: No congestion/rhinnorhea. Mouth/Throat: Mucous membranes are moist. Neck: No stridor.  Cardiovascular: Normal rate, regular rhythm. Grossly normal heart sounds.  Good peripheral circulation. Respiratory: Normal respiratory effort.  No retractions. Lungs CTAB. Gastrointestinal: Soft and nontender. No distention. Musculoskeletal: No lower extremity tenderness nor edema. Neurologic:  Normal speech and language. No gross focal neurologic deficits are appreciated.  Skin:  Skin is warm, dry and intact. No rash noted. Psychiatric: Mood and affect are normal. Speech and behavior are normal.  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 Compared with previous EKG no notable changes ____________________________________________  RADIOLOGY  Indication for imaging denoted ____________________________________________   PROCEDURES  Procedure(s) performed:  None  Procedures  Critical Care performed: No  ____________________________________________   INITIAL IMPRESSION / ASSESSMENT AND PLAN / ED COURSE  Pertinent labs & imaging results that were available during my care of the patient were reviewed by me and considered in my medical decision making (see chart for details).   Patient asymptomatic.  Reports hypertension at home.  Slightly elevated blood pressure in the ER, and on second check normotensive.  He is completely asymptomatic.  Denies any symptoms at all, does check his blood pressure at home and it was elevated.  Discussed with the patient obtaining lab work, patient reports he had about a month ago and was normal and his primary does not wish for any today.  I think this is reasonable.  We will check an ECG which was done and no evidence of change.  Blood sugar normal.  Completely asymptomatic.  Discussed with patient, follow-up with primary care.  Careful return precautions advised  Return precautions and treatment recommendations and follow-up discussed with the  patient who is agreeable with the plan.       ____________________________________________   FINAL CLINICAL IMPRESSION(S) / ED DIAGNOSES  Final diagnoses:  Encounter for medical screening examination  Hypertension, chronic      Note:  This document was prepared using Dragon voice recognition software and may include unintentional dictation errors       Sharyn Creamer, MD 07/28/18 [phone]

## 2018-07-27 NOTE — ED Notes (Signed)
Pt presents w/ c/o HTN @ home. Pt checked his BP and the SBP was 170-180 at the several checks per pt report. Pt denies associated sxs. Pt is A&O x4, ambulatory, w/o complaints. Pt's son @ BS. Discussed normal range of BP.

## 2018-07-27 NOTE — ED Triage Notes (Signed)
Reports blood pressure elevated at home.  Denies accompanying symptoms.

## 2018-07-27 NOTE — ED Notes (Signed)
NAD noted at time of D/C. Pt denies questions or concerns. Pt ambulatory to the lobby at this time.  

## 2018-09-12 ENCOUNTER — Other Ambulatory Visit: Payer: Self-pay | Admitting: Orthopedic Surgery

## 2018-09-12 DIAGNOSIS — M19011 Primary osteoarthritis, right shoulder: Secondary | ICD-10-CM

## 2018-10-10 ENCOUNTER — Inpatient Hospital Stay: Admission: RE | Admit: 2018-10-10 | Payer: Medicare Other | Source: Ambulatory Visit

## 2018-10-14 ENCOUNTER — Ambulatory Visit: Admission: RE | Admit: 2018-10-14 | Payer: Medicare Other | Source: Ambulatory Visit

## 2018-10-21 ENCOUNTER — Ambulatory Visit: Admission: RE | Admit: 2018-10-21 | Payer: Medicare Other | Source: Ambulatory Visit

## 2018-10-22 MED ORDER — PROPOFOL 10 MG/ML IV BOLUS
INTRAVENOUS | Status: AC
  Filled 2018-10-22: qty 20

## 2018-10-23 ENCOUNTER — Other Ambulatory Visit: Payer: Self-pay

## 2018-10-23 ENCOUNTER — Ambulatory Visit
Admission: RE | Admit: 2018-10-23 | Discharge: 2018-10-23 | Disposition: A | Discharge status: Home / Self Care | Payer: Medicare Other | Source: Ambulatory Visit | Attending: Orthopedic Surgery | Admitting: Orthopedic Surgery

## 2018-10-23 ENCOUNTER — Inpatient Hospital Stay: Admission: RE | Admit: 2018-10-23 | Payer: Medicare Other | Source: Home / Self Care | Admitting: Orthopedic Surgery

## 2018-10-23 ENCOUNTER — Ambulatory Visit: Payer: Medicare Other

## 2018-10-23 ENCOUNTER — Encounter: Admission: RE | Payer: Self-pay | Source: Home / Self Care

## 2018-10-23 DIAGNOSIS — M19011 Primary osteoarthritis, right shoulder: Secondary | ICD-10-CM | POA: Diagnosis present

## 2018-10-23 SURGERY — ARTHROPLASTY, SHOULDER, TOTAL
Anesthesia: General | Laterality: Right

## 2018-12-13 ENCOUNTER — Ambulatory Visit: Payer: Self-pay | Admitting: Orthopedic Surgery

## 2018-12-13 ENCOUNTER — Encounter
Admission: RE | Admit: 2018-12-13 | Discharge: 2018-12-13 | Disposition: A | Discharge status: Home / Self Care | Payer: Medicare Other | Source: Ambulatory Visit | Attending: Orthopedic Surgery | Admitting: Orthopedic Surgery

## 2018-12-13 ENCOUNTER — Other Ambulatory Visit: Payer: Self-pay

## 2018-12-13 DIAGNOSIS — Z1159 Encounter for screening for other viral diseases: Secondary | ICD-10-CM | POA: Insufficient documentation

## 2018-12-13 NOTE — Pre-Procedure Instructions (Signed)
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 Compared with previous EKG no notable changes ____________________________________________  RADIOLOGY  Indication for imaging denoted ____________________________________________   PROCEDURES  Procedure(s) performed: None  Procedures  Critical Care performed: No  ____________________________________________   INITIAL IMPRESSION / ASSESSMENT AND PLAN / ED COURSE  Pertinent labs & imaging results that were available during my care of the patient were reviewed by me and considered in my medical decision making (see chart for details).  Patient asymptomatic.  Reports hypertension at home.  Slightly elevated blood pressure in the ER, and on second check normotensive.  He is completely asymptomatic.  Denies any symptoms at all, does check his blood pressure at home and it was elevated.  Discussed with the patient obtaining lab work, patient reports he had about a month ago and was normal and his primary does not wish for any today.  I think this is reasonable.  We will check an ECG which was done and no evidence of change.  Blood sugar normal.  Completely asymptomatic.  Discussed with patient, follow-up with primary care.  Careful return precautions advised  Return precautions and treatment recommendations and follow-up discussed with the patient who is agreeable with the plan.       ____________________________________________   FINAL CLINICAL IMPRESSION(S) / ED DIAGNOSES  Final diagnoses:  Encounter for medical screening examination  Hypertension, chronic      Note:  This document was prepared using Dragon voice recognition software and may include unintentional dictation errors       Delman Kitten, MD 07/28/18 [phone]         Electronically signed by Delman Kitten, MD  at 07/28/2018 9:49 AM   ED on 07/27/2018     Detailed Report

## 2018-12-14 LAB — NOVEL CORONAVIRUS, NAA (HOSP ORDER, SEND-OUT TO REF LAB; TAT 18-24 HRS): SARS-CoV-2, NAA: NOT DETECTED

## 2018-12-16 NOTE — Pre-Procedure Instructions (Signed)
PT CAME IN FOR PREOP ON 6/19-SPANISH INTERPRETER PRESENT. PT DID NOT KNOW ANY OF HIS MEDICATIONS AND WOULD NOT LET HIS SON RUN HOME TO GET MEDS OR LIST OF MEDS.  OTTO, INTERPRETER, TOLD PT THAT HE NEEDED TO GET LIST OF MEDS AND BRING THEM BACK TO PAT. PT SAID HE WOULD BE BACK BY 3 ON Friday AND PT NEVER SHOWED UP. PREOP WAS NOT DONE. I CALLED TIFFANY AT DR Harlow Mares OFFICE AND INFORMED HER THAT PT NEVER SHOWED BACK UP FOR HIS PREOP. SHE WILL CALL PT

## 2018-12-17 ENCOUNTER — Encounter
Admission: RE | Admit: 2018-12-17 | Discharge: 2018-12-17 | Disposition: A | Discharge status: Home / Self Care | Payer: Medicare Other | Source: Ambulatory Visit | Attending: Orthopedic Surgery | Admitting: Orthopedic Surgery

## 2018-12-17 ENCOUNTER — Other Ambulatory Visit: Payer: Self-pay

## 2018-12-17 HISTORY — DX: Other seasonal allergic rhinitis: J30.2

## 2018-12-17 HISTORY — DX: Hyperlipidemia, unspecified: E78.5

## 2018-12-17 HISTORY — DX: Gastro-esophageal reflux disease without esophagitis: K21.9

## 2018-12-17 HISTORY — DX: Hypothyroidism, unspecified: E03.9

## 2018-12-17 LAB — SURGICAL PCR SCREEN
MRSA, PCR: NEGATIVE
Staphylococcus aureus: POSITIVE — AB

## 2018-12-17 LAB — CBC
HCT: 37.4 % — ABNORMAL LOW (ref 39.0–52.0)
Hemoglobin: 12.5 g/dL — ABNORMAL LOW (ref 13.0–17.0)
MCH: 31.2 pg (ref 26.0–34.0)
MCHC: 33.4 g/dL (ref 30.0–36.0)
MCV: 93.3 fL (ref 80.0–100.0)
Platelets: 158 10*3/uL (ref 150–400)
RBC: 4.01 MIL/uL — ABNORMAL LOW (ref 4.22–5.81)
RDW: 13.2 % (ref 11.5–15.5)
WBC: 5.3 10*3/uL (ref 4.0–10.5)
nRBC: 0 % (ref 0.0–0.2)

## 2018-12-17 LAB — URINALYSIS, ROUTINE W REFLEX MICROSCOPIC
Bilirubin Urine: NEGATIVE
Glucose, UA: NEGATIVE mg/dL
Hgb urine dipstick: NEGATIVE
Ketones, ur: NEGATIVE mg/dL
Leukocytes,Ua: NEGATIVE
Nitrite: NEGATIVE
Protein, ur: NEGATIVE mg/dL
Specific Gravity, Urine: 1.016 (ref 1.005–1.030)
pH: 5 (ref 5.0–8.0)

## 2018-12-17 LAB — PROTIME-INR
INR: 1 (ref 0.8–1.2)
Prothrombin Time: 13.4 seconds (ref 11.4–15.2)

## 2018-12-17 LAB — BASIC METABOLIC PANEL
Anion gap: 9 (ref 5–15)
BUN: 18 mg/dL (ref 8–23)
CO2: 27 mmol/L (ref 22–32)
Calcium: 8.9 mg/dL (ref 8.9–10.3)
Chloride: 103 mmol/L (ref 98–111)
Creatinine, Ser: 1.04 mg/dL (ref 0.61–1.24)
GFR calc Af Amer: >60 mL/min (ref >60)
GFR calc non Af Amer: >60 mL/min (ref >60)
Glucose, Bld: 117 mg/dL — ABNORMAL HIGH (ref 70–99)
Potassium: 3.2 mmol/L — ABNORMAL LOW (ref 3.5–5.1)
Sodium: 139 mmol/L (ref 135–145)

## 2018-12-17 LAB — APTT: aPTT: 33 seconds (ref 24–36)

## 2018-12-17 LAB — TYPE AND SCREEN
ABO/RH(D): O POS
Antibody Screen: NEGATIVE

## 2018-12-17 MED ORDER — TRANEXAMIC ACID-NACL 1000-0.7 MG/100ML-% IV SOLN
1000.0000 mg | INTRAVENOUS | Status: AC
  Administered 2018-12-18: 12:00:00 1000 mg via INTRAVENOUS
  Filled 2018-12-17: qty 100

## 2018-12-17 MED ORDER — CEFAZOLIN SODIUM-DEXTROSE 2-4 GM/100ML-% IV SOLN
2.0000 g | INTRAVENOUS | Status: AC
  Administered 2018-12-18: 11:00:00 2 g via INTRAVENOUS

## 2018-12-17 NOTE — Patient Instructions (Signed)
Your procedure is scheduled on: tomorrow  Su procedimiento est programado para: Report to Day Surgery. Presntese a:  Para saber su hora de llegada por favor llame al [phone] entre la 1PM - 3PM el da 8:00 AM  Remember: Instructions that are not followed completely may result in serious medical risk, up to and including death,  or upon the discretion of your surgeon and anesthesiologist your surgery may need to be rescheduled.  Recuerde: Las instrucciones que no se siguen completamente Armed forces logistics/support/administrative officerpueden resultar en un riesgo de salud grave, incluyendo hasta  la [place] o a discrecin de su cirujano y Scientific laboratory techniciananestesilogo, su ciruga se puede posponer.   __X_ 1.Do not eat food after midnight the night before your procedure. No    gum chewing or hard candies. You may drink clear liquids up to 2 hours     before you are scheduled to arrive for your surgery- DO not drink clear     Liquids within 2 hours of the start of your surgery.     Clear Liquids include:    water, apple juice without pulp, clear carbohydrate drink such as    Clearfast of Gartorade, Black Coffee or Tea (Do not add anything to coffee or tea).      No coma nada despus de la medianoche de la noche anterior a su    procedimiento. No coma chicles ni caramelos duros. Puede tomar    lquidos claros hasta 2 horas antes de su hora programada de llegada al     hospital para su procedimiento. No tome lquidos claros durante el     transcurso de las 2 horas de su llegada programada al hospital para su     procedimiento, ya que esto puede llevar a que su procedimiento se   Must finish drink before arrival retrase o tenga que volver a programarse.  Los lquidos claros incluyen:          - Agua o jugo de [place] sin pulpa          - Bebidas claras con carbohidratos como ClearFast o Gatorade          - Caf negro o t claro (sin leche, sin cremas, no agregue nada al caf ni al t)  No tome nada que no est en esta lista.  Los pacientes con  diabetes tipo 1 y tipo 2 solo deben Printmakertomar agua.  Llame a la clnica de PreCare o a la unidad de Same Day Surgery si  tiene alguna pregunta sobre estas instrucciones.              ___ 2.Do Not Smoke or use e-cigarettes For 24 Hours Prior to Your Surgery.    Do not use any chewable tobacco products for at least 6   hours prior to surgery.    No fume ni use cigarrillos electrnicos durante las 24 horas previas    a su [place].  No use ningn producto de tabaco masticable durante   al menos 6 horas antes de la ciruga.     ___ 3. No alcohol for 24 hours before or after surgery.    No tome alcohol durante las 24 horas antes ni despus de la [place].   ____4. Bring all medications with you on the day of surgery if instructed.    Lleve todos los medicamentos con usted el da de su ciruga si se le    ha indicado as.   _x___ 5. Notify your doctor if there is any change in  your medical condition (cold,fever, infections).    Informe a su mdico si hay algn cambio en su condicin mdica  (resfriado, fiebre, infecciones).   Do not wear jewelry, make-up, hairpins, clips or nail polish.  No use joyas, maquillajes, pinzas/ganchos para el cabello ni esmalte de uas.  Do not wear lotions, powders, or perfumes. You may wear deodorant.  No use lociones, polvos o perfumes.  Puede usar desodorante.    Do not shave 48 hours prior to surgery. Men may shave face and neck.  No se afeite 48 horas antes de la Azerba[place]anc[place]uga.  Los hombres pueden Commercial Metals Companyafeitarse la cara  y el cuello.   Do not bring valuables to the hospital.   No lleve objetos de valor al hospital.  [facility] is not responsible for any belongings or valuables.  [place] no se hace responsable de ningn tipo de pertenencias u objetos de Licensed conveyancervalor.               Contacts, dentures or bridgework may not be worn into surgery.  Los lentes de [place]contact[place], las dentaduras postizas o puentes no se pueden usar en la [place]uga.   Leave your suitcase in the car. After  surgery it may be brought to your room.  Deje su maleta en el auto.  Despus de la ciruga podr traerla a su habitacin.   For patients admitted to the hospital, discharge time is determined by your  treatment team.  Para los pacientes que sean ingresados al hospital, el tiempo en el cual se le  dar de alta es determinado por su equipo de [place] [employer].   Patients discharged the day of surgery will not be allowed to drive home. A los pacientes que se les da de alta el mismo da de la ciruga no se les permitir conducir a Higher education careers advisercasa.   Please read over the following fact sheets that you were given: Por favor lea las siguientes hojas de informacin que le dieron:      __x__ Take these medicines the morning of surgery with A SIP OF WATER:          [place]ens-Illinoisome estas medicinas la maana de la ciruga con UN SORBO DE AGUA:  1. Take all meds except Metformin and triamterine-HCTZ aspirin and Naproxen  2.   3.   4.       5.  6.  ____ Fleet Enema (as directed)          Enema de Fleet (segn lo indicado)    __x__ Use CHG Soap as directed          Utilice el jabn de CHG segn lo indicado  ___x_ Use inhalers on the day of surgery          Use los inhaladores el da de la ciruga  ____ Stop metformin 2 days prior to surgery          Deje de tomar el metformin 2 das antes de la ciruga    ____ Take 1/2 of usual insulin dose the night before surgery and none on the morning of surgery           Tome la mitad de la dosis habitual de insulina la noche antes de la [place]uga y no tome nada en la maana de la             ciruga  __x__ Continued aspirin / Don't take morning of surgery          Deje de tomar el Coumadin/Plavix/aspirina el  da:  __x__ Stop Anti-inflammatories Don't take Naproxen           Deje de tomar antiinflamatorios el da:   __x__ Stop supplements until after surgery  Don't take Melatonin tonight          Deje de tomar suplementos hasta despus de la ciruga  ____ Bring C-Pap to  the hospital          [place] al hospital

## 2018-12-17 NOTE — Pre-Procedure Instructions (Signed)
Patient's meds are bubble packed together for morning and evening dosing.  Caregiver is not sure which med is which.  Will bring it to the pharmacy to identify so he can remove the Metformin and the triamteren-hctz naproxen apirin and melatonin.    Instructed to use inhaler but patient states he hasn't used it in months.  Probably will not use it.  Interpreter Jacqui present for interview.  Patient understands English and speaks a little [place]. Patient states he can read a little [place].

## 2018-12-18 ENCOUNTER — Inpatient Hospital Stay
Admission: RE | Admit: 2018-12-18 | Discharge: 2018-12-19 | DRG: 483 | Disposition: A | Discharge status: Home Health | Payer: Medicare Other | Attending: Orthopedic Surgery | Admitting: Orthopedic Surgery

## 2018-12-18 ENCOUNTER — Inpatient Hospital Stay: Payer: Medicare Other | Admitting: Anesthesiology

## 2018-12-18 ENCOUNTER — Encounter
Admission: RE | Disposition: A | Discharge status: Home Health | Payer: Self-pay | Source: Home / Self Care | Attending: Orthopedic Surgery

## 2018-12-18 ENCOUNTER — Inpatient Hospital Stay: Payer: Medicare Other

## 2018-12-18 ENCOUNTER — Encounter: Payer: Self-pay | Admitting: Anesthesiology

## 2018-12-18 DIAGNOSIS — Z96651 Presence of right artificial knee joint: Secondary | ICD-10-CM | POA: Diagnosis present

## 2018-12-18 DIAGNOSIS — Z791 Long term (current) use of non-steroidal anti-inflammatories (NSAID): Secondary | ICD-10-CM

## 2018-12-18 DIAGNOSIS — M12811 Other specific arthropathies, not elsewhere classified, right shoulder: Secondary | ICD-10-CM | POA: Diagnosis present

## 2018-12-18 DIAGNOSIS — Z79899 Other long term (current) drug therapy: Secondary | ICD-10-CM

## 2018-12-18 DIAGNOSIS — I10 Essential (primary) hypertension: Secondary | ICD-10-CM | POA: Diagnosis present

## 2018-12-18 DIAGNOSIS — Z7951 Long term (current) use of inhaled steroids: Secondary | ICD-10-CM

## 2018-12-18 DIAGNOSIS — Z79890 Hormone replacement therapy (postmenopausal): Secondary | ICD-10-CM | POA: Diagnosis not present

## 2018-12-18 DIAGNOSIS — J449 Chronic obstructive pulmonary disease, unspecified: Secondary | ICD-10-CM | POA: Diagnosis present

## 2018-12-18 DIAGNOSIS — M19011 Primary osteoarthritis, right shoulder: Principal | ICD-10-CM | POA: Diagnosis present

## 2018-12-18 DIAGNOSIS — Z7984 Long term (current) use of oral hypoglycemic drugs: Secondary | ICD-10-CM | POA: Diagnosis not present

## 2018-12-18 DIAGNOSIS — E039 Hypothyroidism, unspecified: Secondary | ICD-10-CM | POA: Diagnosis present

## 2018-12-18 DIAGNOSIS — E785 Hyperlipidemia, unspecified: Secondary | ICD-10-CM | POA: Diagnosis present

## 2018-12-18 DIAGNOSIS — M75121 Complete rotator cuff tear or rupture of right shoulder, not specified as traumatic: Secondary | ICD-10-CM | POA: Diagnosis present

## 2018-12-18 DIAGNOSIS — K219 Gastro-esophageal reflux disease without esophagitis: Secondary | ICD-10-CM | POA: Diagnosis present

## 2018-12-18 DIAGNOSIS — R569 Unspecified convulsions: Secondary | ICD-10-CM | POA: Diagnosis present

## 2018-12-18 DIAGNOSIS — J302 Other seasonal allergic rhinitis: Secondary | ICD-10-CM | POA: Diagnosis present

## 2018-12-18 DIAGNOSIS — Z1159 Encounter for screening for other viral diseases: Secondary | ICD-10-CM | POA: Diagnosis not present

## 2018-12-18 DIAGNOSIS — M75101 Unspecified rotator cuff tear or rupture of right shoulder, not specified as traumatic: Secondary | ICD-10-CM | POA: Diagnosis present

## 2018-12-18 DIAGNOSIS — Z7982 Long term (current) use of aspirin: Secondary | ICD-10-CM

## 2018-12-18 DIAGNOSIS — Z09 Encounter for follow-up examination after completed treatment for conditions other than malignant neoplasm: Secondary | ICD-10-CM

## 2018-12-18 HISTORY — PX: TOTAL SHOULDER ARTHROPLASTY: SHX126

## 2018-12-18 LAB — POCT I-STAT 4, (NA,K, GLUC, HGB,HCT)
Glucose, Bld: 142 mg/dL — ABNORMAL HIGH (ref 70–99)
HCT: 37 % — ABNORMAL LOW (ref 39.0–52.0)
Hemoglobin: 12.6 g/dL — ABNORMAL LOW (ref 13.0–17.0)
Potassium: 3.2 mmol/L — ABNORMAL LOW (ref 3.5–5.1)
Sodium: 138 mmol/L (ref 135–145)

## 2018-12-18 LAB — GLUCOSE, CAPILLARY
Glucose-Capillary: 145 mg/dL — ABNORMAL HIGH (ref 70–99)
Glucose-Capillary: 140 mg/dL — ABNORMAL HIGH (ref 70–99)

## 2018-12-18 SURGERY — ARTHROPLASTY, SHOULDER, TOTAL
Anesthesia: Regional | Laterality: Right

## 2018-12-18 MED ORDER — BUPIVACAINE LIPOSOME 1.3 % IJ SUSP
INTRAMUSCULAR | Status: AC
  Filled 2018-12-18: qty 20

## 2018-12-18 MED ORDER — PANTOPRAZOLE SODIUM 40 MG PO TBEC
40.0000 mg | DELAYED_RELEASE_TABLET | Freq: Every day | ORAL | Status: DC
  Administered 2018-12-19: 40 mg via ORAL
  Filled 2018-12-18: qty 1

## 2018-12-18 MED ORDER — BUPIVACAINE-EPINEPHRINE (PF) 0.25% -1:200000 IJ SOLN
INTRAMUSCULAR | Status: DC | PRN
  Administered 2018-12-18: 10 mL via PERINEURAL

## 2018-12-18 MED ORDER — SOD CITRATE-CITRIC ACID 500-334 MG/5ML PO SOLN
ORAL | Status: AC
  Administered 2018-12-18: 14:00:00 15 mL via ORAL
  Filled 2018-12-18: qty 15

## 2018-12-18 MED ORDER — CELECOXIB 200 MG PO CAPS
ORAL_CAPSULE | ORAL | Status: AC
  Administered 2018-12-18: 400 mg via ORAL
  Filled 2018-12-18: qty 2

## 2018-12-18 MED ORDER — ACETAMINOPHEN 500 MG PO TABS
ORAL_TABLET | ORAL | Status: AC
  Administered 2018-12-18: 09:00:00 1000 mg via ORAL
  Filled 2018-12-18: qty 2

## 2018-12-18 MED ORDER — LEVETIRACETAM 750 MG PO TABS
1500.0000 mg | ORAL_TABLET | Freq: Every day | ORAL | Status: DC
  Administered 2018-12-18: 1500 mg via ORAL
  Filled 2018-12-18 (×2): qty 2

## 2018-12-18 MED ORDER — KETOROLAC TROMETHAMINE 15 MG/ML IJ SOLN
7.5000 mg | Freq: Four times a day (QID) | INTRAMUSCULAR | Status: AC
  Administered 2018-12-18 – 2018-12-19 (×4): 7.5 mg via INTRAVENOUS
  Filled 2018-12-18 (×4): qty 1

## 2018-12-18 MED ORDER — DIPHENHYDRAMINE HCL 25 MG PO CAPS
25.0000 mg | ORAL_CAPSULE | Freq: Once | ORAL | Status: DC
  Filled 2018-12-18: qty 1

## 2018-12-18 MED ORDER — EPHEDRINE SULFATE 50 MG/ML IJ SOLN
INTRAMUSCULAR | Status: DC | PRN
  Administered 2018-12-18: 5 mg via INTRAVENOUS
  Administered 2018-12-18 (×2): 15 mg via INTRAVENOUS

## 2018-12-18 MED ORDER — POLYETHYLENE GLYCOL 3350 17 G PO PACK: 17.0000 g | PACK | Freq: Every day | ORAL | Status: DC | PRN

## 2018-12-18 MED ORDER — PHENYLEPHRINE HCL (PRESSORS) 10 MG/ML IV SOLN
INTRAVENOUS | Status: AC
  Filled 2018-12-18: qty 1

## 2018-12-18 MED ORDER — LACTATED RINGERS IV SOLN
INTRAVENOUS | Status: DC
  Administered 2018-12-18 (×2): via INTRAVENOUS

## 2018-12-18 MED ORDER — DIPHENHYDRAMINE HCL 50 MG/ML IJ SOLN
25.0000 mg | Freq: Once | INTRAMUSCULAR | Status: AC
  Administered 2018-12-18: 25 mg via INTRAVENOUS

## 2018-12-18 MED ORDER — DIPHENHYDRAMINE HCL 50 MG/ML IJ SOLN
INTRAMUSCULAR | Status: AC
  Filled 2018-12-18: qty 1

## 2018-12-18 MED ORDER — METFORMIN HCL 500 MG PO TABS
500.0000 mg | ORAL_TABLET | Freq: Two times a day (BID) | ORAL | Status: DC
  Administered 2018-12-18: 500 mg via ORAL
  Filled 2018-12-18 (×2): qty 1

## 2018-12-18 MED ORDER — BUPIVACAINE LIPOSOME 1.3 % IJ SUSP
INTRAMUSCULAR | Status: DC | PRN
  Administered 2018-12-18 (×2): 10 mL

## 2018-12-18 MED ORDER — FENTANYL CITRATE (PF) 100 MCG/2ML IJ SOLN
INTRAMUSCULAR | Status: AC
  Administered 2018-12-18: 09:00:00 50 ug via INTRAVENOUS
  Filled 2018-12-18: qty 2

## 2018-12-18 MED ORDER — PROPOFOL 10 MG/ML IV BOLUS
INTRAVENOUS | Status: AC
  Filled 2018-12-18: qty 20

## 2018-12-18 MED ORDER — MENTHOL 3 MG MT LOZG
1.0000 | LOZENGE | OROMUCOSAL | Status: DC | PRN
  Filled 2018-12-18: qty 9

## 2018-12-18 MED ORDER — BACITRACIN 50000 UNITS IM SOLR
INTRAMUSCULAR | Status: AC
  Filled 2018-12-18: qty 2

## 2018-12-18 MED ORDER — METOCLOPRAMIDE HCL 10 MG PO TABS: 5.0000 mg | ORAL_TABLET | Freq: Three times a day (TID) | ORAL | Status: DC | PRN

## 2018-12-18 MED ORDER — MIDAZOLAM HCL 2 MG/2ML IJ SOLN
INTRAMUSCULAR | Status: AC
  Filled 2018-12-18: qty 2

## 2018-12-18 MED ORDER — LIDOCAINE HCL (PF) 1 % IJ SOLN
INTRAMUSCULAR | Status: DC | PRN
  Administered 2018-12-18: 1 mL
  Administered 2018-12-18: 80 mL

## 2018-12-18 MED ORDER — CHLORHEXIDINE GLUCONATE 4 % EX LIQD: 60.0000 mL | Freq: Once | CUTANEOUS | Status: DC

## 2018-12-18 MED ORDER — PROPOFOL 10 MG/ML IV BOLUS
INTRAVENOUS | Status: DC | PRN
  Administered 2018-12-18: 80 mg via INTRAVENOUS

## 2018-12-18 MED ORDER — SODIUM CHLORIDE 0.9 % IV SOLN
INTRAVENOUS | Status: DC
  Administered 2018-12-18 (×2): via INTRAVENOUS

## 2018-12-18 MED ORDER — FENTANYL CITRATE (PF) 100 MCG/2ML IJ SOLN
INTRAMUSCULAR | Status: DC | PRN
  Administered 2018-12-18 (×2): 50 ug via INTRAVENOUS

## 2018-12-18 MED ORDER — TRIAMTERENE-HCTZ 37.5-25 MG PO TABS
1.0000 | ORAL_TABLET | Freq: Every day | ORAL | Status: DC
  Administered 2018-12-19: 1 via ORAL
  Filled 2018-12-18 (×2): qty 1

## 2018-12-18 MED ORDER — LIDOCAINE HCL (PF) 2 % IJ SOLN
INTRAMUSCULAR | Status: AC
  Filled 2018-12-18: qty 10

## 2018-12-18 MED ORDER — TRAMADOL HCL 50 MG PO TABS
50.0000 mg | ORAL_TABLET | Freq: Four times a day (QID) | ORAL | Status: DC
  Administered 2018-12-18 – 2018-12-19 (×4): 50 mg via ORAL
  Filled 2018-12-18 (×4): qty 1

## 2018-12-18 MED ORDER — PHENYTOIN SODIUM EXTENDED 100 MG PO CAPS
100.0000 mg | ORAL_CAPSULE | Freq: Every day | ORAL | Status: DC
  Administered 2018-12-18: 100 mg via ORAL
  Filled 2018-12-18 (×2): qty 1

## 2018-12-18 MED ORDER — AMLODIPINE BESYLATE 10 MG PO TABS
10.0000 mg | ORAL_TABLET | Freq: Every day | ORAL | Status: DC
  Administered 2018-12-19: 10 mg via ORAL
  Filled 2018-12-18: qty 1

## 2018-12-18 MED ORDER — SODIUM CHLORIDE 0.9 % IR SOLN
Status: DC | PRN
  Administered 2018-12-18: 11:00:00 1000 mL

## 2018-12-18 MED ORDER — HYDROCODONE-ACETAMINOPHEN 7.5-325 MG PO TABS: 1.0000 | ORAL_TABLET | ORAL | Status: DC | PRN

## 2018-12-18 MED ORDER — MAGNESIUM CITRATE PO SOLN
1.0000 | Freq: Once | ORAL | Status: DC | PRN
  Filled 2018-12-18: qty 296

## 2018-12-18 MED ORDER — MIDAZOLAM HCL 2 MG/2ML IJ SOLN
INTRAMUSCULAR | Status: DC | PRN
  Administered 2018-12-18: 2 mg via INTRAVENOUS

## 2018-12-18 MED ORDER — MORPHINE SULFATE (PF) 2 MG/ML IV SOLN: 0.5000 mg | INTRAVENOUS | Status: DC | PRN

## 2018-12-18 MED ORDER — SOD CITRATE-CITRIC ACID 500-334 MG/5ML PO SOLN
15.0000 mL | Freq: Once | ORAL | Status: AC
  Administered 2018-12-18: 15 mL via ORAL

## 2018-12-18 MED ORDER — SODIUM CHLORIDE FLUSH 0.9 % IV SOLN
INTRAVENOUS | Status: AC
  Filled 2018-12-18: qty 20

## 2018-12-18 MED ORDER — PHENYTOIN SODIUM EXTENDED 100 MG PO CAPS
200.0000 mg | ORAL_CAPSULE | Freq: Every morning | ORAL | Status: DC
  Administered 2018-12-19: 200 mg via ORAL
  Filled 2018-12-18: qty 2

## 2018-12-18 MED ORDER — ATORVASTATIN CALCIUM 20 MG PO TABS
80.0000 mg | ORAL_TABLET | Freq: Every day | ORAL | Status: DC
  Administered 2018-12-19: 09:00:00 80 mg via ORAL
  Filled 2018-12-18: qty 4

## 2018-12-18 MED ORDER — BUPIVACAINE HCL (PF) 0.5 % IJ SOLN
INTRAMUSCULAR | Status: AC
  Filled 2018-12-18: qty 10

## 2018-12-18 MED ORDER — FLUTICASONE PROPIONATE 50 MCG/ACT NA SUSP
2.0000 | Freq: Every day | NASAL | Status: DC
  Administered 2018-12-19: 2 via NASAL
  Filled 2018-12-18: qty 16

## 2018-12-18 MED ORDER — DOCUSATE SODIUM 100 MG PO CAPS
100.0000 mg | ORAL_CAPSULE | Freq: Two times a day (BID) | ORAL | Status: DC
  Administered 2018-12-18 – 2018-12-19 (×2): 100 mg via ORAL
  Filled 2018-12-18 (×2): qty 1

## 2018-12-18 MED ORDER — ACETAMINOPHEN 500 MG PO TABS
1000.0000 mg | ORAL_TABLET | Freq: Once | ORAL | Status: AC
  Administered 2018-12-18: 1000 mg via ORAL

## 2018-12-18 MED ORDER — METOPROLOL SUCCINATE ER 50 MG PO TB24
50.0000 mg | ORAL_TABLET | Freq: Every day | ORAL | Status: DC
  Administered 2018-12-19: 09:00:00 50 mg via ORAL
  Filled 2018-12-18: qty 1

## 2018-12-18 MED ORDER — ALBUTEROL SULFATE HFA 108 (90 BASE) MCG/ACT IN AERS: 2.0000 | INHALATION_SPRAY | RESPIRATORY_TRACT | Status: DC | PRN

## 2018-12-18 MED ORDER — FENTANYL CITRATE (PF) 100 MCG/2ML IJ SOLN
50.0000 ug | Freq: Once | INTRAMUSCULAR | Status: AC
  Administered 2018-12-18: 50 ug via INTRAVENOUS

## 2018-12-18 MED ORDER — CEFAZOLIN SODIUM-DEXTROSE 2-4 GM/100ML-% IV SOLN
2.0000 g | Freq: Four times a day (QID) | INTRAVENOUS | Status: AC
  Administered 2018-12-18 – 2018-12-19 (×3): 2 g via INTRAVENOUS
  Filled 2018-12-18 (×3): qty 100

## 2018-12-18 MED ORDER — OLOPATADINE HCL 0.1 % OP SOLN
1.0000 [drp] | Freq: Two times a day (BID) | OPHTHALMIC | Status: DC
  Administered 2018-12-18 – 2018-12-19 (×2): 1 [drp] via OPHTHALMIC
  Filled 2018-12-18: qty 5

## 2018-12-18 MED ORDER — CEFAZOLIN SODIUM-DEXTROSE 2-4 GM/100ML-% IV SOLN
INTRAVENOUS | Status: AC
  Filled 2018-12-18: qty 100

## 2018-12-18 MED ORDER — HYDROCODONE-ACETAMINOPHEN 5-325 MG PO TABS
1.0000 | ORAL_TABLET | ORAL | Status: DC | PRN
  Administered 2018-12-18: 2 via ORAL
  Filled 2018-12-18: qty 2

## 2018-12-18 MED ORDER — SODIUM CHLORIDE 0.9 % IV SOLN
INTRAVENOUS | Status: DC | PRN
  Administered 2018-12-18: 12:00:00 20 ug/min via INTRAVENOUS

## 2018-12-18 MED ORDER — BUPIVACAINE-EPINEPHRINE (PF) 0.25% -1:200000 IJ SOLN
INTRAMUSCULAR | Status: AC
  Filled 2018-12-18: qty 30

## 2018-12-18 MED ORDER — ONDANSETRON HCL 4 MG/2ML IJ SOLN: 4.0000 mg | Freq: Four times a day (QID) | INTRAMUSCULAR | Status: DC | PRN

## 2018-12-18 MED ORDER — SUGAMMADEX SODIUM 200 MG/2ML IV SOLN
INTRAVENOUS | Status: DC | PRN
  Administered 2018-12-18: 200 mg via INTRAVENOUS

## 2018-12-18 MED ORDER — BISACODYL 10 MG RE SUPP: 10.0000 mg | Freq: Every day | RECTAL | Status: DC | PRN

## 2018-12-18 MED ORDER — LEVETIRACETAM 500 MG PO TABS
1000.0000 mg | ORAL_TABLET | Freq: Every morning | ORAL | Status: DC
  Administered 2018-12-19: 09:00:00 1000 mg via ORAL
  Filled 2018-12-18: qty 2

## 2018-12-18 MED ORDER — ACETAMINOPHEN 325 MG PO TABS: 325.0000 mg | ORAL_TABLET | Freq: Four times a day (QID) | ORAL | Status: DC | PRN

## 2018-12-18 MED ORDER — FENTANYL CITRATE (PF) 100 MCG/2ML IJ SOLN
INTRAMUSCULAR | Status: AC
  Filled 2018-12-18: qty 2

## 2018-12-18 MED ORDER — SOD CITRATE-CITRIC ACID 500-334 MG/5ML PO SOLN: 15.0000 mL | Freq: Once | ORAL | Status: DC

## 2018-12-18 MED ORDER — EPHEDRINE SULFATE 50 MG/ML IJ SOLN
INTRAMUSCULAR | Status: AC
  Filled 2018-12-18: qty 1

## 2018-12-18 MED ORDER — PHENOL 1.4 % MT LIQD
1.0000 | OROMUCOSAL | Status: DC | PRN
  Filled 2018-12-18: qty 177

## 2018-12-18 MED ORDER — BUPIVACAINE HCL (PF) 0.5 % IJ SOLN
INTRAMUSCULAR | Status: DC | PRN
  Administered 2018-12-18 (×2): 5 mL

## 2018-12-18 MED ORDER — ICOSAPENT ETHYL 1 G PO CAPS: 2.0000 g | ORAL_CAPSULE | Freq: Two times a day (BID) | ORAL | Status: DC

## 2018-12-18 MED ORDER — PHENYLEPHRINE HCL (PRESSORS) 10 MG/ML IV SOLN
INTRAVENOUS | Status: DC | PRN
  Administered 2018-12-18 (×2): 200 ug via INTRAVENOUS
  Administered 2018-12-18: 100 ug via INTRAVENOUS

## 2018-12-18 MED ORDER — ONDANSETRON HCL 4 MG PO TABS: 4.0000 mg | ORAL_TABLET | Freq: Four times a day (QID) | ORAL | Status: DC | PRN

## 2018-12-18 MED ORDER — CELECOXIB 200 MG PO CAPS
400.0000 mg | ORAL_CAPSULE | Freq: Once | ORAL | Status: AC
  Administered 2018-12-18: 400 mg via ORAL

## 2018-12-18 MED ORDER — METOCLOPRAMIDE HCL 5 MG/ML IJ SOLN: 5.0000 mg | Freq: Three times a day (TID) | INTRAMUSCULAR | Status: DC | PRN

## 2018-12-18 MED ORDER — LEVOTHYROXINE SODIUM 100 MCG PO TABS
100.0000 ug | ORAL_TABLET | Freq: Every day | ORAL | Status: DC
  Administered 2018-12-19: 100 ug via ORAL
  Filled 2018-12-18: qty 1

## 2018-12-18 MED ORDER — LIDOCAINE HCL (PF) 1 % IJ SOLN
INTRAMUSCULAR | Status: AC
  Filled 2018-12-18: qty 5

## 2018-12-18 MED ORDER — ALBUTEROL SULFATE (2.5 MG/3ML) 0.083% IN NEBU: 2.5000 mg | INHALATION_SOLUTION | RESPIRATORY_TRACT | Status: DC | PRN

## 2018-12-18 MED ORDER — GABAPENTIN 300 MG PO CAPS
300.0000 mg | ORAL_CAPSULE | Freq: Three times a day (TID) | ORAL | Status: DC
  Administered 2018-12-18 – 2018-12-19 (×3): 300 mg via ORAL
  Filled 2018-12-18 (×3): qty 1

## 2018-12-18 MED ORDER — ACETAMINOPHEN 500 MG PO TABS
500.0000 mg | ORAL_TABLET | Freq: Four times a day (QID) | ORAL | Status: AC
  Administered 2018-12-18 – 2018-12-19 (×4): 500 mg via ORAL
  Filled 2018-12-18 (×4): qty 1

## 2018-12-18 MED ORDER — TAMSULOSIN HCL 0.4 MG PO CAPS
0.4000 mg | ORAL_CAPSULE | Freq: Every day | ORAL | Status: DC
  Administered 2018-12-19: 09:00:00 0.4 mg via ORAL
  Filled 2018-12-18: qty 1

## 2018-12-18 MED ORDER — ROCURONIUM BROMIDE 100 MG/10ML IV SOLN
INTRAVENOUS | Status: DC | PRN
  Administered 2018-12-18: 20 mg via INTRAVENOUS

## 2018-12-18 SURGICAL SUPPLY — 75 items
BIT DRILL 170X2.5X (BIT) ×1 IMPLANT
BIT DRL 170X2.5X (BIT) ×1
BLADE BOVIE TIP EXT 4 (BLADE) ×3 IMPLANT
BLADE SAW 1 (BLADE) ×3 IMPLANT
BLADE SAW 90X25X1.19 OSCILLAT (BLADE) ×3 IMPLANT
BOWL CEMENT MIX W/ADAPTER (MISCELLANEOUS) ×3 IMPLANT
BRUSH SCRUB EZ  4% CHG (MISCELLANEOUS) ×4
BRUSH SCRUB EZ 4% CHG (MISCELLANEOUS) ×2 IMPLANT
CANISTER SUCT 1200ML W/VALVE (MISCELLANEOUS) ×3 IMPLANT
CANISTER SUCT 3000ML PPV (MISCELLANEOUS) ×6 IMPLANT
CHLORAPREP W/TINT 26 (MISCELLANEOUS) ×6 IMPLANT
CONTROL EPI SZ1 (Orthopedic Implant) ×1 IMPLANT
COVER WAND RF STERILE (DRAPES) ×3 IMPLANT
CRADLE LAMINECT ARM (MISCELLANEOUS) ×6 IMPLANT
CTR EPI SZ1 (Orthopedic Implant) ×3 IMPLANT
DRAPE IMP U-DRAPE 54X76 (DRAPES) ×6 IMPLANT
DRAPE INCISE IOBAN 66X60 STRL (DRAPES) ×6 IMPLANT
DRAPE SHEET LG 3/4 BI-LAMINATE (DRAPES) ×6 IMPLANT
DRAPE TABLE BACK 80X90 (DRAPES) ×3 IMPLANT
DRAPE U-SHAPE 47X51 STRL (DRAPES) ×3 IMPLANT
DRILL 2.5 (BIT) ×2
DRSG TEGADERM 6X8 (GAUZE/BANDAGES/DRESSINGS) ×3 IMPLANT
ELECT REM PT RETURN 9FT ADLT (ELECTROSURGICAL) ×3
ELECTRODE REM PT RTRN 9FT ADLT (ELECTROSURGICAL) ×1 IMPLANT
GAUZE SPONGE 4X4 12PLY STRL (GAUZE/BANDAGES/DRESSINGS) IMPLANT
GAUZE XEROFORM 1X8 LF (GAUZE/BANDAGES/DRESSINGS) ×3 IMPLANT
GLENOSPHERE DXTEND STD 38 (Orthopedic Implant) ×1 IMPLANT
GLENSOPHERE DXTEND STD 38 (Orthopedic Implant) ×3 IMPLANT
GLOVE INDICATOR 8.0 STRL GRN (GLOVE) ×3 IMPLANT
GLOVE SURG ORTHO 8.0 STRL STRW (GLOVE) ×3 IMPLANT
GOWN STRL REUS W/ TWL LRG LVL3 (GOWN DISPOSABLE) ×1 IMPLANT
GOWN STRL REUS W/ TWL XL LVL3 (GOWN DISPOSABLE) ×1 IMPLANT
GOWN STRL REUS W/TWL LRG LVL3 (GOWN DISPOSABLE) ×2
GOWN STRL REUS W/TWL XL LVL3 (GOWN DISPOSABLE) ×2
HOOD PEEL AWAY FLYTE STAYCOOL (MISCELLANEOUS) ×9 IMPLANT
IV NS 1000ML (IV SOLUTION) ×2
IV NS 1000ML BAXH (IV SOLUTION) ×1 IMPLANT
KIT STABILIZATION SHOULDER (MISCELLANEOUS) ×3 IMPLANT
KIT TURNOVER KIT A (KITS) ×3 IMPLANT
MASK FACE SPIDER DISP (MASK) ×3 IMPLANT
MAT ABSORB  FLUID 56X50 GRAY (MISCELLANEOUS) ×2
MAT ABSORB FLUID 56X50 GRAY (MISCELLANEOUS) ×1 IMPLANT
METAGLENE DELTA EXTEND (Trauma) ×1 IMPLANT
METAGLENE DXTEND (Trauma) ×3 IMPLANT
NDL SAFETY ECLIPSE 18X1.5 (NEEDLE) IMPLANT
NEEDLE HYPO 18GX1.5 SHARP (NEEDLE)
NEEDLE REVERSE CUT 1/2 CRC (NEEDLE) ×3 IMPLANT
NS IRRIG 1000ML POUR BTL (IV SOLUTION) ×3 IMPLANT
PACK ARTHROSCOPY SHOULDER (MISCELLANEOUS) ×3 IMPLANT
PIN FIXATION 1/8DIA X 3INL (PIN) ×3 IMPLANT
PIN GUIDE 1.2 (PIN) ×3 IMPLANT
PIN GUIDE GLENOPHERE 1.5MX300M (PIN) ×3 IMPLANT
PIN METAGLENE 2.5 (PIN) ×3 IMPLANT
PULSAVAC PLUS IRRIG FAN TIP (DISPOSABLE) ×3
SCREW 4.5X18MM (Screw) ×4 IMPLANT
SCREW BN 18X4.5XSTRL SHLDR (Screw) ×2 IMPLANT
SCREW LOCK 42 (Screw) ×3 IMPLANT
SCREW LOCK DELTA XTEND 4.5X30 (Screw) ×3 IMPLANT
SLING ARM LRG DEEP (SOFTGOODS) ×3 IMPLANT
SPACER 38 PLUS 3 (Spacer) ×3 IMPLANT
SPONGE LAP 18X18 RF (DISPOSABLE) ×3 IMPLANT
STAPLER SKIN PROX 35W (STAPLE) ×3 IMPLANT
STEM 12 HA (Stem) ×3 IMPLANT
STRAP SAFETY 5IN WIDE (MISCELLANEOUS) ×3 IMPLANT
SUT TICRON 2-0 30IN 311381 (SUTURE) IMPLANT
SUT VIC AB 0 CT2 27 (SUTURE) ×3 IMPLANT
SUT VIC AB 2-0 CT1 18 (SUTURE) ×3 IMPLANT
SUT VIC AB PLUS 45CM 1-MO-4 (SUTURE) IMPLANT
SYR 10ML LL (SYRINGE) ×3 IMPLANT
SYRINGE IRR TOOMEY STRL 70CC (SYRINGE) ×3 IMPLANT
TAPE SUT 30 1/2 CRC GREEN (SUTURE) ×6 IMPLANT
TIP FAN IRRIG PULSAVAC PLUS (DISPOSABLE) ×1 IMPLANT
TUBING CONNECTING 10 (TUBING) ×2 IMPLANT
TUBING CONNECTING 10' (TUBING) ×1
WATER STERILE IRR 1000ML POUR (IV SOLUTION) ×6 IMPLANT

## 2018-12-18 NOTE — Anesthesia Procedure Notes (Addendum)
Anesthesia Regional Block: Interscalene brachial plexus block   Pre-Anesthetic Checklist: ,, timeout performed, Correct Patient, Correct Site, Correct Laterality, Correct Procedure, Correct Position, site marked, Risks and benefits discussed,  Surgical consent,  Pre-op evaluation,  At surgeon's request and post-op pain management  Laterality: Right  Prep: chloraprep       Needles:  Injection technique: Single-shot  Needle Type: Stimiplex     Needle Length: 9cm  Needle Gauge: 21     Additional Needles:   Procedures:,,,, ultrasound used (permanent image in chart),,,,  Narrative:   Performed by: Personally  Anesthesiologist: Durenda Hurt, MD  Additional Notes: Negative aspiration.  Negative paresthesia on injection.  Dose given in divided aliquots under ultrasound guidance.

## 2018-12-18 NOTE — Progress Notes (Signed)
Pt received sodium citrate  And  Says his throat tightness and chest better

## 2018-12-18 NOTE — Progress Notes (Signed)
Called son so that he could be in th conversation about anesthesia. Dr Ola Spurr spoke with patient, son and interpreter present for conversation.

## 2018-12-18 NOTE — Transfer of Care (Signed)
Immediate Anesthesia Transfer of Care Note  Patient: Kevin Burns  Procedure(s) Performed: TOTAL SHOULDER ARTHROPLASTY (Right )  Patient Location: PACU  Anesthesia Type:General  Level of Consciousness: sedated  Airway & Oxygen Therapy: Patient Spontanous Breathing and Patient connected to face mask oxygen  Post-op Assessment: Report given to RN and Post -op Vital signs reviewed and stable  Post vital signs: Reviewed and stable  Last Vitals:  Vitals Value Taken Time  BP 157/65 12/18/18 1302  Temp    Pulse 60 12/18/18 1303  Resp 19 12/18/18 1303  SpO2 100 % 12/18/18 1303  Vitals shown include unvalidated device data.  Last Pain:  Vitals:   12/18/18 0830  TempSrc: Temporal  PainSc: 0-No pain         Complications: No apparent anesthesia complications

## 2018-12-18 NOTE — H&P (Signed)
The patient has been re-examined, and the chart reviewed, and there have been no interval changes to the documented history and physical.  Plan a right reverse shoulder arthroplasty today.  Anesthesia is consulted regarding a peripheral nerve block for post-operative pain.  The risks, benefits, and alternatives have been discussed at length, and the patient is willing to proceed.     

## 2018-12-18 NOTE — Anesthesia Preprocedure Evaluation (Addendum)
Anesthesia Evaluation  Patient identified by MRN, date of birth, ID band Patient awake    Reviewed: Allergy & Precautions, H&P , NPO status , Patient's Chart, lab work & pertinent test results  Airway Mallampati: III  TM Distance: >3 FB    Comment: Large neck Dental  (+) Upper Dentures Edentulous lower:   Pulmonary COPD (denies COPD but has an albuterol inhaler that he uses PRN), neg recent URI,           Cardiovascular hypertension, On Medications and On Home Beta Blockers (-) angina(-) Past MI, (-) Cardiac Stents and (-) CABG (-) dysrhythmias      Neuro/Psych Seizures - (15 years since last seizure),  negative psych ROS   GI/Hepatic Neg liver ROS, GERD  Controlled,  Endo/Other  Hypothyroidism   Renal/GU      Musculoskeletal   Abdominal   Peds  Hematology negative hematology ROS (+)   Anesthesia Other Findings Obese with protuberant abdomen  Past Medical History: No date: GERD (gastroesophageal reflux disease) No date: Hyperlipidemia No date: Hypertension No date: Hypothyroidism No date: Seasonal allergies No date: Seizures ([place])  Past Surgical History: No date: JOINT REPLACEMENT     Comment:  right knee No date: REPLACEMENT TOTAL KNEE     Comment:  right     Reproductive/Obstetrics negative OB ROS                            Anesthesia Physical Anesthesia Plan  ASA: II  Anesthesia Plan: General ETT and Regional   Post-op Pain Management:    Induction:   PONV Risk Score and Plan: Ondansetron, Dexamethasone, Midazolam and Treatment may vary due to age or medical condition  Airway Management Planned:   Additional Equipment:   Intra-op Plan:   Post-operative Plan:   Informed Consent: I have reviewed the patients History and Physical, chart, labs and discussed the procedure including the risks, benefits and alternatives for the proposed anesthesia with the patient or  authorized representative who has indicated his/her understanding and acceptance.     Dental Advisory Given  Plan Discussed with: Anesthesiologist and CRNA  Anesthesia Plan Comments: (GETA + interscalene block)       Anesthesia Quick Evaluation

## 2018-12-18 NOTE — Anesthesia Procedure Notes (Addendum)
Procedure Name: Intubation Date/Time: 12/18/2018 10:41 AM Performed by: Justus Memory, CRNA Pre-anesthesia Checklist: Patient identified, Patient being monitored, Timeout performed, Emergency Drugs available and Suction available Patient Re-evaluated:Patient Re-evaluated prior to induction Oxygen Delivery Method: Circle system utilized Preoxygenation: Pre-oxygenation with 100% oxygen Induction Type: IV induction Ventilation: Mask ventilation with difficulty and Oral airway inserted - appropriate to patient size Laryngoscope Size: Mac, McGraph and 4 Grade View: Grade I Tube type: Oral Tube size: 7.0 mm Number of attempts: 1 Airway Equipment and Method: Stylet and Video-laryngoscopy Placement Confirmation: ETT inserted through vocal cords under direct vision,  positive ETCO2 and breath sounds checked- equal and bilateral Secured at: 21 cm Tube secured with: Tape Dental Injury: Teeth and Oropharynx as per pre-operative assessment  Difficulty Due To: Difficulty was anticipated, Difficult Airway- due to large tongue and Difficult Airway- due to reduced neck mobility Future Recommendations: Recommend- induction with short-acting agent, and alternative techniques readily available

## 2018-12-18 NOTE — Evaluation (Signed)
Physical Therapy Evaluation Patient Details Name: Kevin Burns MRN: [id number] DOB: 03/11/1932 Today's Date: 12/18/2018   History of Present Illness  Patient is an 85 y/o male that presents for R reverse total shoulder arthroplasty on 12/18/2018.  Clinical Impression  Patient is an independent 85 y/o male that lives with his son at home. He has not used AD prior to this procedure, and denies any falls recently. He is R handed, but is understanding that he will not be able to utilize his RUE for functional activities for at least 6 weeks. He demonstrated 2 episodes of mild loss of balance during ambulation, though he does report some mild numbness in his feet post-operatively. He would likely benefit from HHPT after discharge from this admission. Of note his surgical sling was fitted on multiple occasions by this therapist and did not appear to provide sufficient support, discussed abduction brace with orthopedic team.     Follow Up Recommendations Home health PT    Equipment Recommendations  Cane(Quad cane)    Recommendations for Other Services       Precautions / Restrictions Precautions Precautions: Fall;Shoulder Shoulder Interventions: Shoulder sling/immobilizer Restrictions Weight Bearing Restrictions: Yes RUE Weight Bearing: Non weight bearing Other Position/Activity Restrictions: Sling on at all times.      Mobility  Bed Mobility Overal bed mobility: Independent             General bed mobility comments: Cued to hold RUE with LUE during transfer, is otherwise able to control torso well.  Transfers Overall transfer level: Needs assistance Equipment used: None Transfers: Sit to/from Stand Sit to Stand: Min guard         General transfer comment: Patient required balance assistance from therapist during transfer, otherwise swift with just mild balance deficit.  Ambulation/Gait Ambulation/Gait assistance: Min guard Gait Distance (Feet): 75 Feet Assistive device:  None Gait Pattern/deviations: WFL(Within Functional Limits)   Gait velocity interpretation: <1.31 ft/sec, indicative of household ambulator General Gait Details: Patient had one episode of anterior loss of balance, though noted some deficits in sensation in his LEs.  Stairs            Wheelchair Mobility    Modified Rankin (Stroke Patients Only)       Balance Overall balance assessment: Mild deficits observed, not formally tested                                           Pertinent Vitals/Pain Pain Assessment: No/denies pain    Home Living Family/patient expects to be discharged to:: Private residence Living Arrangements: Children Available Help at Discharge: Family Type of Home: House Home Access: Level entry     Home Layout: One level Home Equipment: None      Prior Function Level of Independence: Independent         Comments: Patient reports he is independent at baseline, denies any recent falls.     Hand Dominance        Extremity/Trunk Assessment   Upper Extremity Assessment Upper Extremity Assessment: RUE deficits/detail RUE: Unable to fully assess due to immobilization    Lower Extremity Assessment Lower Extremity Assessment: Overall WFL for tasks assessed       Communication   Communication: No difficulties;Prefers language other than English;Other (comment)(Spanish is primary language, but can converse in [place])  Cognition Arousal/Alertness: Awake/alert Behavior During Therapy: WFL for tasks assessed/performed Overall Cognitive Status:  Within Functional Limits for tasks assessed                                        General Comments General comments (skin integrity, edema, etc.): Bandage in place on RUE.    Exercises General Exercises - Upper Extremity Wrist Flexion: AROM;Right;10 reps Wrist Extension: AROM;Right;10 reps Digit Composite Flexion: AROM;10 reps;Right Other Exercises Other  Exercises: Scapular squeezes with verbal and tactile cuing x15   Assessment/Plan    PT Assessment Patient needs continued PT services  PT Problem List Decreased strength;Decreased range of motion;Decreased mobility;Decreased safety awareness;Decreased activity tolerance;Decreased balance;Decreased knowledge of use of DME       PT Treatment Interventions DME instruction;Therapeutic exercise;Gait training;Balance training;Stair training;Neuromuscular re-education;Therapeutic activities;Patient/family education    PT Goals (Current goals can be found in the Care Plan section)  Acute Rehab PT Goals Patient Stated Goal: To return home safely. PT Goal Formulation: With patient Time For Goal Achievement: 01/01/19 Potential to Achieve Goals: Good    Frequency BID   Barriers to discharge        Co-evaluation               AM-PAC PT "6 Clicks" Mobility  Outcome Measure Help needed turning from your back to your side while in a flat bed without using bedrails?: A Little Help needed moving from lying on your back to sitting on the side of a flat bed without using bedrails?: A Little Help needed moving to and from a bed to a chair (including a wheelchair)?: A Little Help needed standing up from a chair using your arms (e.g., wheelchair or bedside chair)?: A Little Help needed to walk in hospital room?: A Little Help needed climbing 3-5 steps with a railing? : A Little 6 Click Score: 18    End of Session Equipment Utilized During Treatment: Gait belt Activity Tolerance: Patient tolerated treatment well Patient left: in chair;with chair alarm set;with call bell/phone within reach Nurse Communication: Mobility status PT Visit Diagnosis: Other abnormalities of gait and mobility (R26.89);Other (comment)(R Reverse Total Shoulder)    Time: 1761-6073 PT Time Calculation (min) (ACUTE ONLY): 21 min   Charges:   PT Evaluation $PT Eval Moderate Complexity: 1 Mod PT  Treatments $Therapeutic Exercise: 8-22 mins       Royce Macadamia PT, DPT, CSCS    12/18/2018, 4:24 PM

## 2018-12-18 NOTE — Op Note (Signed)
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  Surgeon:   Cassell SmilesJames Amiree No, MD  Assistant:   Hurshel KeysAlex Meloy, PA-C  Anesthesia:   General endotracheal with an interscalene block placed preoperatively by the anesthesiologist.  Findings:   As above.  Complications:   None  EBL:  100 cc  Drains:   None  Closure:   Staples  Implants:   All press-fit J&J Delta Xtend Metaglene, Glenosphere standard 38 mm, Humeral PE Cup standard 38 mm by +3 mm, Porocoat standard stem size 12 mm  Brief Clinical Note:   The patient has failed multiple conservative treatments for painful rotator tear arthropathy, including injections, medications and therapy. The patient presents at this time for a reverse right total shoulder arthroplasty.  Procedure:   The patient underwent placement of an interscalene block by the anesthesiologist in the preoperative holding area before being brought into the operating room and lain in the supine position. The patient then underwent general endotracheal intubation and anesthesia before a Foley catheter was inserted and the patient repositioned in the beach chair position using the beach chair positioner. The right shoulder and upper extremity were prepped with ChloraPrep solution before being draped sterilely. Preoperative antibiotics were administered. A standard anterior approach to the shoulder was made through an approximately 4-5 inch incision. The incision was carried down through the subcutaneous tissues to expose the deltopectoral fascia. The interval between the deltoid and pectoralis muscles was identified and this plane developed, retracting the cephalic vein laterally with  the deltoid muscle. The conjoined tendon was identified. Its lateral margin was dissected and the Kolbel self-retraining retractor inserted. The "three sisters" were identified and cauterized. Bursal tissues were removed to improve visualization. The subscapularis tendon was released from its attachment to the lesser tuberosity 1 cm proximal to its insertion and several tagging sutures placed. The inferior capsule was released with care after identifying and protecting the axillary nerve. The proximal humeral cut was made at approximately 30 of retroversion using the extra-medullary guide.   Attention was redirected to the glenoid. The labrum was debrided circumferentially before the center of the glenoid was marked with electrocautery. The guidewire was drilled into the glenoid neck using the appropriate guide. After verifying its position, it was overreamed with the mini-baseplate reamer to create a flat surface. The permanent mini-baseplate was impacted into place. It was stabilized with four peripheral screws. Locking screws were placed superiorly and inferiorly while nonlocking screws were placed anteriorly and posteriorly. The permanent 38 mm glenosphere was then screwed into place and its locking mechanism verified using manual distraction.  Attention was directed to the humeral side. The humeral canal was reamed sequentially beginning with the end-cutting reamer then progressing from a 6 mm reamer up to a 12 mm reamer. This provided excellent circumferential chatter. The canal was broached. This was left in place and a trial reduction performed using the standard trial humeral platform. The arm demonstrated excellent range of motion as the hand could be brought across the chest to the opposite shoulder and brought to the top of the patient's head and to the patient's ear. The shoulder appeared stable throughout this range of motion. The joint was dislocated and the trial components removed. The  permanent porocoat 12 mm stem was impacted into place with care taken to maintain the appropriate version. The PE cup +3 mm was placed. The shoulder was relocated using two finger pressure and again placed through a range of motion with the findings as described above.  The wound was copiously irrigated with bacitracin saline solution using the jet lavage system. The subscapularis tendon was reapproximated using cottony Dacron Mason allen interrupted sutures. The deltopectoral interval was closed using #0 Vicryl interrupted sutures before the subcutaneous tissues were closed using 2-0 Vicryl interrupted sutures. The skin was closed using staples. A sterile occlusive dressing was applied to the wound before the arm was placed into a sling. A Polar Care system also was applied to the shoulder. The patient was then transferred back to a hospital bed before being awakened, extubated, and returned to the recovery room in satisfactory condition after tolerating the procedure well.

## 2018-12-18 NOTE — Anesthesia Post-op Follow-up Note (Signed)
Anesthesia QCDR form completed.        

## 2018-12-19 MED ORDER — HYDROCODONE-ACETAMINOPHEN 5-325 MG PO TABS: 1.0000 | ORAL_TABLET | ORAL | 0 refills | Status: DC | PRN

## 2018-12-19 NOTE — TOC Initial Note (Signed)
Transition of Care [facility]) - Initial/Assessment Note    Patient Details  Name: Kevin Burns MRN: [id number] Date of Birth: [birth year]  [facility] CM/SW Contact:    Kevin Burns, Kevin Burns Phone Number: [phone]  12/19/2018, 10:35 AM  Clinical Narrative: PT is recommending home health. Clinical Social Worker (CSW) met with patient to discuss D/C plan. Patient was alert and oriented X4 and was sitting up on the side of the bed. CSW introduced self and explained role of CSW department. Per patient he lives in an apartment in [place] and his 4 y.o son assists him. Per patient he is independent with his ADLS. CSW explained home health process and that surgeon's office prefers [facility]. Patient is agreeable to [facility] home [facility]. Kevin Burns [facility] representative is aware of above. PT is recommending a quad cane for patient and he stated he does not have one at home. CSW made [place] D[employer] agency representative aware of above. CSW will continue to follow and assist as needed.               Expected Discharge Plan: [place] Barriers to Discharge: Continued Medical Work up   Patient Goals and CMS Choice Patient states their goals for this hospitalization and ongoing recovery are:: Improve mobility   Choice offered to / list presented to : Patient  Expected Discharge Plan and Services Expected Discharge Plan: [place] In-house Referral: Clinical Social Work Discharge Planning Services: CM Consult Post Acute Care Choice: [place] arrangements for the past 2 months: Apartment                 DME Arranged: Kevin Burns) DME Agency: [employer] Date DME Agency Contacted: 12/19/18 Time DME Agency Contacted: [phone] Representative spoke with at DME Agency: Kevin Burns: PT, OT [place] Agency: [facility] (formerly [employer]) Date [place]: 12/19/18 Time [place]: 0930 Representative spoke with at  [place]: Kevin Burns  Prior Living Arrangements/Services Living arrangements for the past 2 months: [place] with:: Self Patient language and need for interpreter reviewed:: No Do you feel safe going back to the place where you live?: Yes      Need for Family Participation in Patient Care: Yes (Comment) Care giver support system in place?: Yes (comment)   Criminal Activity/Legal Involvement Pertinent to Current Situation/Hospitalization: No - Comment as needed  Activities of Daily Living Home Assistive Devices/Equipment: Eyeglasses, Dentures (specify type) ADL Screening (condition at time of admission) Patient's cognitive ability adequate to safely complete daily activities?: Yes Is the patient deaf or have difficulty hearing?: No Does the patient have difficulty seeing, even when wearing glasses/contacts?: No Does the patient have difficulty concentrating, remembering, or making decisions?: No Patient able to express need for assistance with ADLs?: Yes Does the patient have difficulty dressing or bathing?: No Independently performs ADLs?: Yes (appropriate for developmental age) Does the patient have difficulty walking or climbing stairs?: No Weakness of Legs: None Weakness of Arms/Hands: Right  Permission Sought/Granted Permission sought to share information with : (Home Health and DME agency) Permission granted to share information with : Yes, Verbal Permission Granted              Emotional Assessment Appearance:: Appears younger than stated age   Affect (typically observed): Pleasant, Calm Orientation: : Oriented to Self, Oriented to Place, Oriented to  Time, Oriented to Situation Alcohol / Substance Use: Not Applicable Psych Involvement: No (comment)  Admission diagnosis:  M19.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 Rotator cuff tear arthropathy of right shoulder 12/18/2018   PCP:  [facility], [place]:  No Pharmacies Listed    Social Determinants of Health (SDOH) Interventions    Readmission Risk Interventions No flowsheet data found.

## 2018-12-19 NOTE — Progress Notes (Signed)
Physical Therapy Treatment Patient Details Name: Kevin Burns MRN: [id number] DOB: August 20, 1931 Today's Date: 12/19/2018    History of Present Illness Patient is an 85 y/o male that presents for R reverse total shoulder arthroplasty on 12/18/2018.    PT Comments    Patient instructed on use of QC this date, with notable improvement in gait mechanics and safety. No obvious loss of balance observed with QC in hand, however in brief attempt to ambulate without device he demonstrated mild loss of balance anteriorly. He required adjustment with sling on multiple occasions and was finally able to adjust sling to appropriate level. He was able to ambulate household distance, however would likely continue to benefit from HHPT at discharge.   Follow Up Recommendations  Home health PT     Equipment Recommendations  Cane(Quad cane)    Recommendations for Other Services       Precautions / Restrictions Precautions Precautions: Fall;Shoulder Shoulder Interventions: Shoulder sling/immobilizer Restrictions Weight Bearing Restrictions: Yes RUE Weight Bearing: Non weight bearing Other Position/Activity Restrictions: Sling on at all times.    Mobility  Bed Mobility Overal bed mobility: Needs Assistance Bed Mobility: Supine to Sit     Supine to sit: Mod assist     General bed mobility comments: Patient requires mod A x 1 to manage torso due to core weakness.  Transfers Overall transfer level: Needs assistance Equipment used: Quad cane Transfers: Sit to/from Stand Sit to Stand: Min guard         General transfer comment: Patient required minimal balance assistance from therapist during transfer, otherwise appropriate with no deficit in speed.  Ambulation/Gait Ambulation/Gait assistance: Min guard Gait Distance (Feet): 150 Feet Assistive device: Quad cane Gait Pattern/deviations: WFL(Within Functional Limits)   Gait velocity interpretation: <1.31 ft/sec, indicative of household  ambulator General Gait Details: Patient attempted to ambulate without cane briefly and is still unstable. He is able to use QC appropriately and demonstrates no obvious balance deficits with QC.   Stairs             Wheelchair Mobility    Modified Rankin (Stroke Patients Only)       Balance Overall balance assessment: Mild deficits observed, not formally tested                                          Cognition Arousal/Alertness: Awake/alert Behavior During Therapy: WFL for tasks assessed/performed Overall Cognitive Status: Within Functional Limits for tasks assessed                                        Exercises General Exercises - Upper Extremity Wrist Flexion: AROM;Right;15 reps Wrist Extension: AROM;Right;15 reps Digit Composite Flexion: AROM;10 reps;Right Composite Extension: AROM;Right;15 reps Other Exercises Other Exercises: Scapular squeezes with verbal and tactile cuing x15 Other Exercises: Ulnar/radial deviation x 15 on RUE    General Comments General comments (skin integrity, edema, etc.): Sling adjusted as patient's arm was sliding out when therapist initially entered the room.      Pertinent Vitals/Pain Pain Assessment: Faces Faces Pain Scale: Hurts a little bit Pain Location: R shoulder Pain Descriptors / Indicators: Operative site guarding;Aching Pain Intervention(s): Limited activity within patient's tolerance;Monitored during session    Home Living  Prior Function            PT Goals (current goals can now be found in the care plan section) Acute Rehab PT Goals Patient Stated Goal: To return home safely. PT Goal Formulation: With patient Time For Goal Achievement: 01/01/19 Potential to Achieve Goals: Good Progress towards PT goals: Progressing toward goals    Frequency    BID      PT Plan Current plan remains appropriate    Co-evaluation               AM-PAC PT "6 Clicks" Mobility   Outcome Measure  Help needed turning from your back to your side while in a flat bed without using bedrails?: A Little Help needed moving from lying on your back to sitting on the side of a flat bed without using bedrails?: A Little Help needed moving to and from a bed to a chair (including a wheelchair)?: A Little Help needed standing up from a chair using your arms (e.g., wheelchair or bedside chair)?: A Little Help needed to walk in hospital room?: A Little Help needed climbing 3-5 steps with a railing? : A Little 6 Click Score: 18    End of Session Equipment Utilized During Treatment: Gait belt Activity Tolerance: Patient tolerated treatment well Patient left: in chair;with chair alarm set;with call bell/phone within reach Nurse Communication: Mobility status PT Visit Diagnosis: Other abnormalities of gait and mobility (R26.89);Other (comment)(R Reverse Total Shoulder)     Time: [id number] PT Time Calculation (min) (ACUTE ONLY): 25 min  Charges:  $Gait Training: 8-22 mins $Therapeutic Exercise: 8-22 mins            Alva GarnetPatrick McNamara PT, DPT, CSCS             12/19/2018, 11:31 AM

## 2018-12-19 NOTE — TOC Transition Note (Signed)
Transition of Care [facility]) - CM/SW Discharge Note   Patient Details  Name: Kevin Burns MRN: [id number] Date of Birth: 26-Jul-1931  [facility] CM/SW Contact:  Nanami Whitelaw, Lenice Llamas Phone Number: [phone]  12/19/2018, 2:02 PM   Clinical Narrative: Clinical Social Worker (CSW) notified [name] [facility] home health representative that patient will D/C home today. [facility] [employer] agency representative delivered quad cane to patient today. Please reconsult if future social work needs arise. CSW signing off.     Final next level of care: [place] Barriers to Discharge: Barriers Resolved   Patient Goals and CMS Choice Patient states their goals for this hospitalization and ongoing recovery are:: Improve mobility CMS Medicare.gov Compare Post Acute Care list provided to:: (Patient's surgeon prefers [facility] for home health.) Choice offered to / list presented to : Patient(Patient's surgeon prefers [facility] for home health.)  Discharge Placement                       Discharge Plan and Services In-house Referral: Clinical Social Work Discharge Planning Services: CM Consult Post Acute Care Choice: Home Health          DME Arranged: [place]) DME Agency: [employer] Date DME Agency Contacted: 12/19/18 Time DME Agency Contacted: [phone] Representative spoke with at DME Agency: [place]: PT, OT [place] Agency: [facility] ([facility] [employer] Date [place]: 12/19/18 Time [place]: 0930 Representative spoke with at [place]: [place] ([place]) Interventions     Readmission Risk Interventions No flowsheet data found.

## 2018-12-19 NOTE — Anesthesia Postprocedure Evaluation (Signed)
Anesthesia Post Note  Patient: Kevin Burns  Procedure(s) Performed: TOTAL SHOULDER ARTHROPLASTY (Right )  Patient location during evaluation: PACU Anesthesia Type: Regional and General Level of consciousness: awake and alert Pain management: pain level controlled Vital Signs Assessment: post-procedure vital signs reviewed and stable Respiratory status: spontaneous breathing, nonlabored ventilation and respiratory function stable Cardiovascular status: blood pressure returned to baseline and stable Postop Assessment: no apparent nausea or vomiting Anesthetic complications: no Comments: Pt c/o throat tightness and itching --> IV Benadryl.  Small petechial rash on neck at site of interscalene nerve block, non-allergic in appearance.  Itching appears secondary to tape used to secure shoulder brace. C/o chest tightness --> EKG, unchanged from prior. Throat and chest tightness --> suspected GERD --> PO sodium citrate with complete resolution of symptoms.       Last Vitals:  Vitals:   12/19/18 0411 12/19/18 0732  BP: 133/78 109/82  Pulse: 67 67  Resp: 18 19  Temp: (!) 36.3 C 36.8 C  SpO2: 98% 95%    Last Pain:  Vitals:   12/19/18 0732  TempSrc: Oral  PainSc:                  Durenda Hurt

## 2018-12-19 NOTE — Discharge Summary (Signed)
Physician Discharge Summary  Patient ID: Kevin Burns MRN: [id number] DOB/AGE: 83/15/1933 83 y.o.  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 GERD (gastroesophageal reflux disease)   . Hyperlipidemia   . Hypertension   . Hypothyroidism   . Seasonal allergies   . Seizures ([place])     Surgeries: Procedure(s): TOTAL SHOULDER ARTHROPLASTY on 12/18/2018   Consultants (if any):   Discharged Condition: Improved  Hospital Course: Kevin Burns is an 85 y.o. 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    He was given perioperative antibiotics:  Anti-infectives (From admission, onward)   Start     Dose/Rate Route Frequency Ordered Stop   12/18/18 1700  ceFAZolin (ANCEF) IVPB 2g/100 mL premix     2 g 200 mL/hr over 30 Minutes Intravenous Every 6 hours 12/18/18 1450 12/19/18 0716   12/18/18 1115  50,000 units bacitracin in 0.9% normal saline 250 mL irrigation  Status:  Discontinued       As needed 12/18/18 1116 12/18/18 1252   12/18/18 0828  ceFAZolin (ANCEF) 2-4 GM/100ML-% IVPB     Note to Pharmacy: Lyman Bishop   : cabinet override      12/18/18 0828 12/18/18 1053   12/18/18 0600  ceFAZolin (ANCEF) IVPB 2g/100 mL premix     2 g 200 mL/hr over 30 Minutes Intravenous On call to O.R. 12/17/18 2141 12/18/18 1058    .  He was given sequential compression devices, early ambulation, and aspirin for DVT prophylaxis.  He benefited maximally from the hospital stay and there were no complications.    Recent vital signs:  Vitals:   12/19/18 0411 12/19/18 0732  BP: 133/78 109/82  Pulse: 67 67  Resp: 18 19  Temp: (!) 97.4 F (36.3 C) 98.3 F (36.8 C)  SpO2: 98% 95%    Recent laboratory studies:  Lab Results  Component Value Date   HGB 12.6 (L) 12/18/2018   HGB 12.5 (L) 12/17/2018   HGB 13.8 04/02/2016   Lab Results  Component Value Date   WBC 5.3 12/17/2018   PLT 158 12/17/2018   Lab Results  Component Value Date   INR 1.0 12/17/2018   Lab Results  Component Value Date   NA 138 12/18/2018   K 3.2 (L) 12/18/2018   CL 103 12/17/2018   CO2 27 12/17/2018   BUN 18 12/17/2018   CREATININE 1.04 12/17/2018   GLUCOSE 142 (H) 12/18/2018    Discharge Medications:   Allergies as of 12/19/2018   No Known Allergies     Medication List    TAKE these medications   albuterol (2.5 MG/3ML) 0.083% nebulizer solution Commonly known as: PROVENTIL Take 2.5 mg by nebulization every 4 (four) hours as needed for wheezing or shortness of  breath.   albuterol 108 (90 Base) MCG/ACT inhaler Commonly known as: VENTOLIN HFA Inhale 2 puffs into the lungs every 4 (four) hours as needed for wheezing or shortness of breath. Every 4-6 hours as needed for wheezing or cough   amLODipine 10 MG tablet Commonly known as: NORVASC Take 1 tablet by mouth daily.   aspirin 81 MG chewable tablet Chew 81 mg by mouth daily.   atorvastatin 80 MG tablet Commonly known as: LIPITOR Take 1 tablet by mouth daily.   bisacodyl 5 MG EC tablet Commonly known as: DULCOLAX Take 5 mg  by mouth daily as needed for moderate constipation. 1-3 as needed for constipation   docusate sodium 100 MG capsule Commonly known as: COLACE Take 100 mg by mouth 2 (two) times daily.   fluticasone 50 MCG/ACT nasal spray Commonly known as: FLONASE Place 2 sprays into both nostrils daily.   HYDROcodone-acetaminophen 5-325 MG tablet Commonly known as: NORCO/VICODIN Take 1 tablet by mouth every 4 (four) hours as needed for moderate pain (pain score 4-6).   levETIRAcetam 500 MG tablet Commonly known as: KEPPRA Take 5 tablets by mouth daily. 2 tabs in the AM and three in the PM   levothyroxine 100 MCG tablet Commonly known as: SYNTHROID Take 1 tablet by mouth daily.   Melatonin 1 MG Tabs Take 5 mg by mouth at bedtime as needed.   metFORMIN 500 MG tablet Commonly known as: GLUCOPHAGE Take 500 mg by mouth 2 (two) times daily with a meal.   metoprolol succinate 100 MG 24 hr tablet Commonly known as: TOPROL-XL Take 50 mg by mouth daily.   naphazoline-glycerin 0.012-0.2 % Soln Commonly known as: CLEAR EYES REDNESS 1-2 drops 4 (four) times daily as needed for eye irritation.   naproxen 500 MG tablet Commonly known as: NAPROSYN Take 500 mg by mouth 2 (two) times daily as needed.   omeprazole 40 MG capsule Commonly known as: PRILOSEC Take 20 mg by mouth daily.   Pataday 0.2 % Soln Generic drug: Olopatadine HCl Apply to eye once. 1 gtt once daily   phenytoin 100 MG ER capsule Commonly known as: DILANTIN Take 3 capsules by mouth daily. 2 in AM and 1 at HS   polyethylene glycol 17 g packet Commonly known as: MIRALAX / GLYCOLAX Take 17 g by mouth daily.   Systane Balance 0.6 % Soln Generic drug: Propylene Glycol Apply to eye daily as needed. 2-3 drops   tamsulosin 0.4 MG Caps capsule Commonly known as: FLOMAX Take 1 capsule by mouth daily.   traZODone 50 MG tablet Commonly known as: DESYREL Take 50 mg by mouth at bedtime as needed for sleep. 1/2 tab   triamcinolone  cream 0.5 % Commonly known as: KENALOG Apply 1 application topically daily.   triamterene-hydrochlorothiazide 37.5-25 MG tablet Commonly known as: MAXZIDE-25 Take 1 tablet by mouth daily.   Vascepa 1 g Caps Generic drug: Icosapent Ethyl Take 2 tablets by mouth 2 (two) times a day.            Durable Medical Equipment  (From admission, onward)         Start     Ordered   12/19/18 1157  For home use only DME Cane  Once     12/19/18 1156          Diagnostic Studies: Dg Shoulder Right Port  Result Date: 12/18/2018 CLINICAL DATA:  Status post shoulder replacement EXAM: PORTABLE RIGHT SHOULDER COMPARISON:  Right shoulder CT October 23, 2018 FINDINGS: Oblique  and Y scapular images obtained. There is a total shoulder replacement on the right with prosthetic components well-seated. No acute fracture or dislocation. Moderate osteoarthritic changes noted in the acromioclavicular joint. Visualized right lung clear. IMPRESSION: Status post total shoulder replacement right with prosthetic components appearing well-seated. No acute fracture or dislocation. Moderate osteoarthritic change noted in the acromioclavicular joint. Electronically Signed   By: Bretta BangWilliam  Woodruff III M.D.   On: 12/18/2018 15:37    Disposition: Discharge disposition: 01-Home or Self Care            Signed: Lyndle HerrlichJames R Lidya Mccalister ,MD 12/19/2018, 12:12 PM

## 2018-12-19 NOTE — Discharge Instructions (Signed)
Wear sling at all times, including sleep.  You will need to use the sling for a total of 4 weeks following surgery. ° °Do not try and lift your arm up or away from your body for any reason.  ° °Keep the dressing dry.  You may remove bandage in 3 days.  You may place Band-Aids over top of the incision. ° °May shower once dressing is removed in 3 days.  Remove sling carefully only for showers, leaving arm down by your side while in the shower. ° °+++ Make sure to take some pain medication this evening before you fall asleep, in preparation for the nerve block wearing off in the middle of the night. ° °If the the pain medication causes itching, or is too strong, try taking a single tablet at a time, or combining with Benadryl. ° °You may be most comfortable sleeping in a recliner.  If you do sleep in near bed, placed pillows behind the shoulder that have the operation to support it. °

## 2018-12-19 NOTE — Evaluation (Signed)
Occupational Therapy Evaluation Patient Details Name: Kevin Burns MRN: [id number] DOB: 05/29/1932 Today's Date: 12/19/2018    History of Present Illness Patient is an 85 y/o male that presents for R reverse total shoulder arthroplasty on 12/18/2018.   Clinical Impression   Pt seen for OT eval/treat. Spanish interpreter, Maryjane HurterOtto, utilized during entirety of session. Majority of session spent on instruction for RUE shoulder precautions, sling mgt, adaptive strategies for bathing, dressing, grooming tasks, and falls prevention strategies. Pt states he is very independent and denies difficulty being able to return home today and care for himself. Pt required cues during session to maintain precautions. Pt reports having his son who can help if the pt calls him. Pt verbalized understanding of all instruction provided. Pt will benefit from skilled OT services to maximize safety, adherence to precautions, and minimize risk of falls or injury during recovery. Recommend HHOT services.     Follow Up Recommendations  Home health OT    Equipment Recommendations  Other (comment)(reacher)    Recommendations for Other Services       Precautions / Restrictions Precautions Precautions: Fall;Shoulder Shoulder Interventions: Shoulder sling/immobilizer Restrictions Weight Bearing Restrictions: Yes RUE Weight Bearing: Non weight bearing Other Position/Activity Restrictions: Sling on at all times.      Mobility Bed Mobility     General bed mobility comments: deferred, up in recliner  Transfers Overall transfer level: Needs assistance Equipment used: Quad cane Transfers: Sit to/from Stand Sit to Stand: Min guard         General transfer comment: Patient required minimal balance assistance from therapist during transfer, otherwise appropriate with no deficit in speed.    Balance Overall balance assessment: Mild deficits observed, not formally tested                                          ADL either performed or assessed with clinical judgement   ADL                                         General ADL Comments: Min A for dressing and RUE underarm grooming     Vision Patient Visual Report: No change from baseline       Perception     Praxis      Pertinent Vitals/Pain Pain Assessment: No/denies pain Faces Pain Scale: Hurts a little bit Pain Location: R shoulder Pain Descriptors / Indicators: Operative site guarding;Aching Pain Intervention(s): Limited activity within patient's tolerance;Monitored during session     Hand Dominance     Extremity/Trunk Assessment Upper Extremity Assessment Upper Extremity Assessment: RUE deficits/detail RUE: Unable to fully assess due to immobilization RUE Sensation: WNL   Lower Extremity Assessment Lower Extremity Assessment: Overall WFL for tasks assessed       Communication Communication Communication: No difficulties;Prefers language other than English;Other (comment)(Spanish is primary language, but can converse in [place])   Cognition Arousal/Alertness: Awake/alert Behavior During Therapy: WFL for tasks assessed/performed Overall Cognitive Status: Within Functional Limits for tasks assessed                                     General Comments  Sling adjusted as patient's arm was sliding out when therapist initially entered the room.  Exercises  Other Exercises: shoulder precautions, how to maintain during ADL, sling positioning and wear schedule, falls prevention strategies   Shoulder Instructions      Home Living Family/patient expects to be discharged to:: Private residence Living Arrangements: Children Available Help at Discharge: Family;Available PRN/intermittently Type of Home: House Home Access: Level entry     Home Layout: One level     Bathroom Shower/Tub: Teacher, early years/pre: Standard     Home Equipment: None           Prior Functioning/Environment Level of Independence: Independent        Comments: Patient reports he is independent at baseline, denies any recent falls.        OT Problem List: Decreased strength;Decreased range of motion;Impaired UE functional use;Decreased knowledge of precautions      OT Treatment/Interventions: Self-care/ADL training;Therapeutic exercise;Therapeutic activities;DME and/or AE instruction;Patient/family education;Balance training    OT Goals(Current goals can be found in the care plan section) Acute Rehab OT Goals Patient Stated Goal: To return home safely. OT Goal Formulation: With patient Time For Goal Achievement: 01/02/19 Potential to Achieve Goals: Good ADL Goals Pt Will Perform Upper Body Dressing: with modified independence;sitting Additional ADL Goal #1: Pt will independently instruct family/caregiver in shoulder sling mgt Additional ADL Goal #2: Pt will independently demonstrate understanding of shoulder precautions and how to maintain during ADL.  OT Frequency: Min 1X/week   Barriers to D/C: Decreased caregiver support          Co-evaluation              AM-PAC OT "6 Clicks" Daily Activity     Outcome Measure Help from another person eating meals?: None Help from another person taking care of personal grooming?: A Little Help from another person toileting, which includes using toliet, bedpan, or urinal?: None Help from another person bathing (including washing, rinsing, drying)?: A Little Help from another person to put on and taking off regular upper body clothing?: A Little Help from another person to put on and taking off regular lower body clothing?: A Little 6 Click Score: 20   End of Session    Activity Tolerance: Patient tolerated treatment well Patient left: in chair;with call bell/phone within reach  OT Visit Diagnosis: Other abnormalities of gait and mobility (R26.89)                Time: 1315-1330 OT Time Calculation  (min): 15 min Charges:  OT General Charges $OT Visit: 1 Visit OT Evaluation $OT Eval Low Complexity: 1 Low OT Treatments $Self Care/Home Management : 8-22 mins  Jeni Salles, MPH, MS, OTR/L ascom [phone] 12/19/18, 2:37 PM

## 2018-12-20 LAB — SURGICAL PATHOLOGY

## 2018-12-23 ENCOUNTER — Encounter: Payer: Self-pay | Admitting: Orthopedic Surgery

## 2020-01-21 ENCOUNTER — Emergency Department
Admission: EM | Admit: 2020-01-21 | Discharge: 2020-01-21 | Disposition: A | Discharge status: Home / Self Care | Payer: Medicare Other | Attending: Emergency Medicine | Admitting: Emergency Medicine

## 2020-01-21 ENCOUNTER — Other Ambulatory Visit: Payer: Self-pay

## 2020-01-21 ENCOUNTER — Encounter: Payer: Self-pay | Admitting: Emergency Medicine

## 2020-01-21 DIAGNOSIS — Z79899 Other long term (current) drug therapy: Secondary | ICD-10-CM | POA: Diagnosis not present

## 2020-01-21 DIAGNOSIS — E039 Hypothyroidism, unspecified: Secondary | ICD-10-CM | POA: Insufficient documentation

## 2020-01-21 DIAGNOSIS — I10 Essential (primary) hypertension: Secondary | ICD-10-CM | POA: Insufficient documentation

## 2020-01-21 DIAGNOSIS — Z7982 Long term (current) use of aspirin: Secondary | ICD-10-CM | POA: Insufficient documentation

## 2020-01-21 DIAGNOSIS — Z7984 Long term (current) use of oral hypoglycemic drugs: Secondary | ICD-10-CM | POA: Insufficient documentation

## 2020-01-21 DIAGNOSIS — Z96651 Presence of right artificial knee joint: Secondary | ICD-10-CM | POA: Insufficient documentation

## 2020-01-21 DIAGNOSIS — Z7902 Long term (current) use of antithrombotics/antiplatelets: Secondary | ICD-10-CM | POA: Insufficient documentation

## 2020-01-21 DIAGNOSIS — L309 Dermatitis, unspecified: Secondary | ICD-10-CM | POA: Diagnosis not present

## 2020-01-21 DIAGNOSIS — L299 Pruritus, unspecified: Secondary | ICD-10-CM | POA: Diagnosis present

## 2020-01-21 MED ORDER — TRIAMCINOLONE ACETONIDE 0.1 % EX CREA: 1.0000 "application " | TOPICAL_CREAM | Freq: Four times a day (QID) | CUTANEOUS | 1 refills | Status: AC

## 2020-01-21 MED ORDER — EUCERIN EX CREA: TOPICAL_CREAM | CUTANEOUS | 1 refills | Status: DC | PRN

## 2020-01-21 MED ORDER — HYDROXYZINE PAMOATE 50 MG PO CAPS: 50.0000 mg | ORAL_CAPSULE | Freq: Three times a day (TID) | ORAL | 0 refills | Status: DC | PRN

## 2020-01-21 NOTE — ED Provider Notes (Signed)
[facility] Emergency Department Provider Note  ____________________________________________  Time seen: Approximately 7:58 PM  I have reviewed the triage vital signs and the nursing notes.   HISTORY  Chief Complaint Pruritis    HPI Kevin Burns is a 86 y.o. male who presents the emergency department complaining of dry and itchy skin.  Patient states that he has been experiencing symptoms x3 weeks.   Patient states that he had seen primary care and was diagnosed with "itchy skin."  Patient was given topical triamcinolone and Vistaril.  He states that it helps with the symptoms but when the medicines were off the itching returns.  Patient son presented with the patient states that he has noted several dry patches of skin.  There is been no hives, lesions.  No new foods, medications, topicals.        Past Medical History:  Diagnosis Date  . GERD (gastroesophageal reflux disease)   . Hyperlipidemia   . Hypertension   . Hypothyroidism   . Seasonal allergies   . Seizures [facility])     Patient Active Problem List   Diagnosis Date Noted  . Rotator cuff tear arthropathy of right shoulder 12/18/2018    Past Surgical History:  Procedure Laterality Date  . JOINT REPLACEMENT     right knee  . REPLACEMENT TOTAL KNEE     right  . TOTAL SHOULDER ARTHROPLASTY Right 12/18/2018   Procedure: TOTAL SHOULDER ARTHROPLASTY;  Surgeon: Lyndle Herrlich, MD;  Location: [facility] ORS;  Service: Orthopedics;  Laterality: Right;    Prior to Admission medications   Medication Sig Start Date End Date Taking? Authorizing Provider  albuterol (PROVENTIL) (2.5 MG/3ML) 0.083% nebulizer solution Take 2.5 mg by nebulization every 4 (four) hours as needed for wheezing or shortness of breath.    [provider]  albuterol (VENTOLIN HFA) 108 (90 Base) MCG/ACT inhaler Inhale 2 puffs into the lungs every 4 (four) hours as needed for wheezing or shortness of breath. Every 4-6 hours as  needed for wheezing or cough    [provider]  amLODipine (NORVASC) 10 MG tablet Take 1 tablet by mouth daily. 11/17/14   [provider]  aspirin 81 MG chewable tablet Chew 81 mg by mouth daily.    [provider]  atorvastatin (LIPITOR) 80 MG tablet Take 1 tablet by mouth daily. 11/18/14   [provider]  bisacodyl (DULCOLAX) 5 MG EC tablet Take 5 mg by mouth daily as needed for moderate constipation. 1-3 as needed for constipation    [provider]  clopidogrel (PLAVIX) 75 MG tablet Take 75 mg by mouth daily.    [provider]  docusate sodium (COLACE) 100 MG capsule Take 100 mg by mouth 2 (two) times daily.    [provider]  fluticasone (FLONASE) 50 MCG/ACT nasal spray Place 2 sprays into both nostrils daily.    [provider]  furosemide (LASIX) 20 MG tablet Take 20 mg by mouth daily.    [provider]  hydrOXYzine (VISTARIL) 50 MG capsule Take 1 capsule (50 mg total) by mouth 3 (three) times daily as needed. 01/21/20   Nioka Thorington, Delorise Royals, PA-C  levETIRAcetam (KEPPRA) 500 MG tablet Take 5 tablets by mouth daily. 2 tabs in the AM and three in the PM 11/17/14   [provider]  levothyroxine (SYNTHROID) 112 MCG tablet Take 112 mcg by mouth daily before breakfast. 04/14/16   [provider]  Melatonin 1 MG TABS Take 5 mg by  mouth at bedtime as needed.    [provider]  metFORMIN (GLUCOPHAGE) 500 MG tablet Take 500 mg by mouth 2 (two) times daily with a meal.    [provider]  metoprolol succinate (TOPROL-XL) 100 MG 24 hr tablet Take 50 mg by mouth daily.  11/17/14   [provider]  naphazoline-glycerin (CLEAR EYES REDNESS) 0.012-0.2 % SOLN 1-2 drops 4 (four) times daily as needed for eye irritation.    [provider]  naproxen (NAPROSYN) 500 MG tablet Take 500 mg by mouth 2 (two) times daily as needed.    [provider]  Olopatadine HCl  (PATADAY) 0.2 % SOLN Apply to eye once. 1 gtt once daily    [provider]  omeprazole (PRILOSEC) 40 MG capsule Take 20 mg by mouth daily.  11/19/14   [provider]  phenytoin (DILANTIN) 100 MG ER capsule Take 3 capsules by mouth daily. 2 in AM and 1 at [facility] 11/17/14   [provider]  polyethylene glycol (MIRALAX / GLYCOLAX) 17 g packet Take 17 g by mouth daily.    [provider]  Propylene Glycol (SYSTANE BALANCE) 0.6 % SOLN Apply to eye daily as needed. 2-3 drops    [provider]  Skin Protectants, Misc. (EUCERIN) cream Apply topically as needed for dry skin. 01/21/20   Harleyquinn Gasser, Delorise Royals, PA-C  tamsulosin (FLOMAX) 0.4 MG CAPS capsule Take 1 capsule by mouth daily. 11/17/14   [provider]  traZODone (DESYREL) 50 MG tablet Take 50 mg by mouth at bedtime as needed for sleep. 1/2 tab    [provider]  triamcinolone cream (KENALOG) 0.1 % Apply 1 application topically 4 (four) times daily. 01/21/20   Tamela Elsayed, Delorise Royals, PA-C  triamcinolone cream (KENALOG) 0.5 % Apply 1 application topically daily.    [provider]  triamterene-hydrochlorothiazide (MAXZIDE-25) 37.5-25 MG per tablet Take 1 tablet by mouth daily. 11/19/14   [provider]  VASCEPA 1 G CAPS Take 2 tablets by mouth 2 (two) times a day.  11/18/14   [provider]    Allergies Patient has no known allergies.  No family history on file.  Social History Social History   Tobacco Use  . Smoking status: Never Smoker  . Smokeless tobacco: Never Used  Vaping Use  . Vaping Use: Never used  Substance Use Topics  . Alcohol use: No  . Drug use: Never     Review of Systems  Constitutional: No fever/chills Eyes: No visual changes. No discharge ENT: No upper respiratory complaints. Cardiovascular: no chest pain. Respiratory: no cough. No SOB. Gastrointestinal: No abdominal pain.  No nausea, no vomiting.  No diarrhea.  No  constipation. Musculoskeletal: Negative for musculoskeletal pain. Skin: Pruritus with patches of dry skin Neurological: Negative for headaches, focal weakness or numbness. 10-point ROS otherwise negative.  ____________________________________________   PHYSICAL EXAM:  VITAL SIGNS: ED Triage Vitals  Enc Vitals Group     BP 01/21/20 1654 (!) 163/84     Pulse Rate 01/21/20 1654 53     Resp 01/21/20 1654 16     Temp 01/21/20 1654 98.1 F (36.7 C)     Temp Source 01/21/20 1654 Oral     SpO2 01/21/20 1654 97 %     Weight 01/21/20 1653 (!) 213 lb 3 oz (96.7 kg)     Height 01/21/20 1653 5\' 6"  (1.676 m)     Head Circumference --      Peak Flow --  Pain Score 01/21/20 1653 0     Pain Loc --      Pain Edu? --      Excl. in GC? --      Constitutional: Alert and oriented. Well appearing and in no acute distress. Eyes: Conjunctivae are normal. PERRL. EOMI. Head: Atraumatic. ENT:      Ears:       Nose: No congestion/rhinnorhea.      Mouth/Throat: Mucous membranes are moist.  Neck: No stridor.    Cardiovascular: Normal rate, regular rhythm. Normal S1 and S2.  Good peripheral circulation. Respiratory: Normal respiratory effort without tachypnea or retractions. Lungs CTAB. Good air entry to the bases with no decreased or absent breath sounds. Musculoskeletal: Full range of motion to all extremities. No gross deformities appreciated. Neurologic:  Normal speech and language. No gross focal neurologic deficits are appreciated.  Skin:  Skin is warm, dry and intact.  Scattered patches of dry sign consistent with eczema.  No erythema or edema concerning for cellulitis.  Patient does have excoriations from scratching.  No hives, wheals.  No bullae. Psychiatric: Mood and affect are normal. Speech and behavior are normal. Patient exhibits appropriate insight and judgement.   ____________________________________________   LABS (all labs ordered are listed, but only abnormal results are  displayed)  Labs Reviewed - No data to display ____________________________________________  EKG   ____________________________________________  RADIOLOGY   No results found.  ____________________________________________    PROCEDURES  Procedure(s) performed:    Procedures    Medications - No data to display   ____________________________________________   INITIAL IMPRESSION / ASSESSMENT AND PLAN / ED COURSE  Pertinent labs & imaging results that were available during my care of the patient were reviewed by me and considered in my medical decision making (see chart for details).  Review of the [place] CSRS was performed in accordance of the [facility] prior to dispensing any controlled drugs.           Patient's diagnosis is consistent with eczema.  Patient presented to emergency department complaining of patches of dry pruritic skin.  This is been ongoing x2 weeks.  No aggravating factor precipitating this complaint.  Physical exam is consistent with eczema.  I will prescribe the patient Eucerin, triamcinolone, Vistaril.  Follow-up primary care as needed..  Patient is given ED precautions to return to the ED for any worsening or new symptoms.     ____________________________________________  FINAL CLINICAL IMPRESSION(S) / ED DIAGNOSES  Final diagnoses:  Eczema, unspecified type      NEW MEDICATIONS STARTED DURING THIS VISIT:  ED Discharge Orders         Ordered    Skin Protectants, Misc. (EUCERIN) cream  As needed     Discontinue  Reprint     01/21/20 2010    triamcinolone cream (KENALOG) 0.1 %  4 times daily     Discontinue  Reprint     01/21/20 2010    hydrOXYzine (VISTARIL) 50 MG capsule  3 times daily PRN     Discontinue  Reprint     01/21/20 2010              This chart was dictated using voice recognition software/Dragon. Despite best efforts to proofread, errors can occur which can change the meaning. Any change was purely unintentional.     Racheal Patches, PA-C 01/21/20 2015    Delton Prairie, MD 01/21/20 2249

## 2020-01-21 NOTE — ED Triage Notes (Signed)
C/O itching to back and legs x 1 week.

## 2020-01-21 NOTE — ED Notes (Signed)
Pt has rash on his back that has been itching since yesterday.

## 2020-08-29 ENCOUNTER — Other Ambulatory Visit: Payer: Self-pay

## 2020-08-29 ENCOUNTER — Encounter: Payer: Self-pay | Admitting: Emergency Medicine

## 2020-08-29 ENCOUNTER — Ambulatory Visit
Admission: EM | Admit: 2020-08-29 | Discharge: 2020-08-29 | Disposition: A | Discharge status: Home / Self Care | Payer: Medicare Other | Attending: Physician Assistant | Admitting: Physician Assistant

## 2020-08-29 DIAGNOSIS — R42 Dizziness and giddiness: Secondary | ICD-10-CM

## 2020-08-29 DIAGNOSIS — R001 Bradycardia, unspecified: Secondary | ICD-10-CM | POA: Insufficient documentation

## 2020-08-29 LAB — CBC WITH DIFFERENTIAL/PLATELET
Abs Immature Granulocytes: 0.01 10*3/uL (ref 0.00–0.07)
Basophils Absolute: 0 10*3/uL (ref 0.0–0.1)
Basophils Relative: 0 %
Eosinophils Absolute: 0.1 10*3/uL (ref 0.0–0.5)
Eosinophils Relative: 1 %
HCT: 39.1 % (ref 39.0–52.0)
Hemoglobin: 13.2 g/dL (ref 13.0–17.0)
Immature Granulocytes: 0 %
Lymphocytes Relative: 36 %
Lymphs Abs: 1.8 10*3/uL (ref 0.7–4.0)
MCH: 31.6 pg (ref 26.0–34.0)
MCHC: 33.8 g/dL (ref 30.0–36.0)
MCV: 93.5 fL (ref 80.0–100.0)
Monocytes Absolute: 0.5 10*3/uL (ref 0.1–1.0)
Monocytes Relative: 10 %
Neutro Abs: 2.6 10*3/uL (ref 1.7–7.7)
Neutrophils Relative %: 53 %
Platelets: 179 10*3/uL (ref 150–400)
RBC: 4.18 MIL/uL — ABNORMAL LOW (ref 4.22–5.81)
RDW: 13.2 % (ref 11.5–15.5)
WBC: 5 10*3/uL (ref 4.0–10.5)
nRBC: 0 % (ref 0.0–0.2)

## 2020-08-29 LAB — COMPREHENSIVE METABOLIC PANEL
ALT: 10 U/L (ref 0–44)
AST: 25 U/L (ref 15–41)
Albumin: 4.1 g/dL (ref 3.5–5.0)
Alkaline Phosphatase: 83 U/L (ref 38–126)
Anion gap: 11 (ref 5–15)
BUN: 21 mg/dL (ref 8–23)
CO2: 26 mmol/L (ref 22–32)
Calcium: 9.5 mg/dL (ref 8.9–10.3)
Chloride: 101 mmol/L (ref 98–111)
Creatinine, Ser: 1.12 mg/dL (ref 0.61–1.24)
GFR, Estimated: >60 mL/min (ref >60)
Glucose, Bld: 103 mg/dL — ABNORMAL HIGH (ref 70–99)
Potassium: 3.9 mmol/L (ref 3.5–5.1)
Sodium: 138 mmol/L (ref 135–145)
Total Bilirubin: 0.6 mg/dL (ref 0.3–1.2)
Total Protein: 7.8 g/dL (ref 6.5–8.1)

## 2020-08-29 NOTE — ED Provider Notes (Signed)
[facility]    CSN: [id number] Arrival date & time: 08/29/20  1344      History   Chief Complaint Chief Complaint  Patient presents with  . Dizziness    HPI Kevin Burns is a 87 y.o. male.   Kevin Burns presents with complaints of episode of dizziness yesterday morning. He stood up out of bed and felt unsteady as he was ambulating to his restroom. Once he reached the counter to hold on and stabilize, symptoms resolved. He has not had any dizziness since. This was not associated with chest pain , headache, vision changes, nausea or sweating. He feels well now and has no current complaints. No recent vomiting or diarrhea. He doesn't drink alcohol. No recent falls or head injury. History of gerd, htn, allergies and seizures.   ROS per HPI, negative if not otherwise mentioned.      Past Medical History:  Diagnosis Date  . GERD (gastroesophageal reflux disease)   . Hyperlipidemia   . Hypertension   . Hypothyroidism   . Seasonal allergies   . Seizures [facility])     Patient Active Problem List   Diagnosis Date Noted  . Rotator cuff tear arthropathy of right shoulder 12/18/2018    Past Surgical History:  Procedure Laterality Date  . JOINT REPLACEMENT     right knee  . REPLACEMENT TOTAL KNEE     right  . TOTAL SHOULDER ARTHROPLASTY Right 12/18/2018   Procedure: TOTAL SHOULDER ARTHROPLASTY;  Surgeon: Lyndle Herrlich, MD;  Location: [facility] ORS;  Service: Orthopedics;  Laterality: Right;       Home Medications    Prior to Admission medications   Medication Sig Start Date End Date Taking? Authorizing Provider  albuterol (PROVENTIL) (2.5 MG/3ML) 0.083% nebulizer solution Take 2.5 mg by nebulization every 4 (four) hours as needed for wheezing or shortness of breath.    [provider]  albuterol (VENTOLIN HFA) 108 (90 Base) MCG/ACT inhaler Inhale 2 puffs into the lungs every 4 (four) hours as needed for wheezing or shortness of breath. Every 4-6 hours as  needed for wheezing or cough    [provider]  amLODipine (NORVASC) 10 MG tablet Take 1 tablet by mouth daily. 11/17/14   [provider]  aspirin 81 MG chewable tablet Chew 81 mg by mouth daily.    [provider]  atorvastatin (LIPITOR) 80 MG tablet Take 1 tablet by mouth daily. 11/18/14   [provider]  bisacodyl (DULCOLAX) 5 MG EC tablet Take 5 mg by mouth daily as needed for moderate constipation. 1-3 as needed for constipation    [provider]  clopidogrel (PLAVIX) 75 MG tablet Take 75 mg by mouth daily.    [provider]  docusate sodium (COLACE) 100 MG capsule Take 100 mg by mouth 2 (two) times daily.    [provider]  fluticasone (FLONASE) 50 MCG/ACT nasal spray Place 2 sprays into both nostrils daily.    [provider]  furosemide (LASIX) 20 MG tablet Take 20 mg by mouth daily.    [provider]  hydrOXYzine (VISTARIL) 50 MG capsule Take 1 capsule (50 mg total) by mouth 3 (three) times daily as needed. 01/21/20   Cuthriell, Delorise Royals, PA-C  levETIRAcetam (KEPPRA) 500 MG tablet Take 5 tablets by mouth daily. 2 tabs in the AM and three in the PM 11/17/14   [provider]  levothyroxine (SYNTHROID) 112 MCG tablet Take 112 mcg by mouth daily before breakfast. 04/14/16  [provider]  Melatonin 1 MG TABS Take 5 mg by mouth at bedtime as needed.    [provider]  metFORMIN (GLUCOPHAGE) 500 MG tablet Take 500 mg by mouth 2 (two) times daily with a meal.    [provider]  metoprolol succinate (TOPROL-XL) 100 MG 24 hr tablet Take 50 mg by mouth daily.  11/17/14   [provider]  naphazoline-glycerin (CLEAR EYES REDNESS) 0.012-0.2 % SOLN 1-2 drops 4 (four) times daily as needed for eye irritation.    [provider]  naproxen (NAPROSYN) 500 MG tablet Take 500 mg by mouth 2 (two) times daily as needed.    [provider]  Olopatadine HCl  (PATADAY) 0.2 % SOLN Apply to eye once. 1 gtt once daily    [provider]  omeprazole (PRILOSEC) 40 MG capsule Take 20 mg by mouth daily.  11/19/14   [provider]  phenytoin (DILANTIN) 100 MG ER capsule Take 3 capsules by mouth daily. 2 in AM and 1 at [facility] 11/17/14   [provider]  polyethylene glycol (MIRALAX / GLYCOLAX) 17 g packet Take 17 g by mouth daily.    [provider]  Propylene Glycol (SYSTANE BALANCE) 0.6 % SOLN Apply to eye daily as needed. 2-3 drops    [provider]  Skin Protectants, Misc. (EUCERIN) cream Apply topically as needed for dry skin. 01/21/20   Cuthriell, Delorise Royals, PA-C  tamsulosin (FLOMAX) 0.4 MG CAPS capsule Take 1 capsule by mouth daily. 11/17/14   [provider]  traZODone (DESYREL) 50 MG tablet Take 50 mg by mouth at bedtime as needed for sleep. 1/2 tab    [provider]  triamcinolone cream (KENALOG) 0.1 % Apply 1 application topically 4 (four) times daily. 01/21/20   Cuthriell, Delorise Royals, PA-C  triamcinolone cream (KENALOG) 0.5 % Apply 1 application topically daily.    [provider]  triamterene-hydrochlorothiazide (MAXZIDE-25) 37.5-25 MG per tablet Take 1 tablet by mouth daily. 11/19/14   [provider]  VASCEPA 1 G CAPS Take 2 tablets by mouth 2 (two) times a day.  11/18/14   [provider]    Family History History reviewed. No pertinent family history.  Social History Social History   Tobacco Use  . Smoking status: Never Smoker  . Smokeless tobacco: Never Used  Vaping Use  . Vaping Use: Never used  Substance Use Topics  . Alcohol use: No  . Drug use: Never     Allergies   Patient has no known allergies.   Review of Systems Review of Systems   Physical Exam Triage Vital Signs ED Triage Vitals  Enc Vitals Group     BP 08/29/20 1358 (!) 157/79     Pulse Rate 08/29/20 1358 (!) 55     Resp 08/29/20 1358 16     Temp 08/29/20 1358 97.9 F (36.6  C)     Temp Source 08/29/20 1358 Oral     SpO2 08/29/20 1358 99 %     Weight 08/29/20 1357 176 lb (79.8 kg)     Height 08/29/20 1357 5\' 6"  (1.676 m)     Head Circumference --      Peak Flow --      Pain Score 08/29/20 1357 2     Pain Loc --      Pain Edu? --      Excl. in GC? --    Orthostatic VS for the past 24 hrs:  BP- Lying  Pulse- Lying BP- Sitting Pulse- Sitting BP- Standing at 0 minutes Pulse- Standing at 0 minutes  08/29/20 1411 157/67 56 147/66 57 153/71 61  08/29/20 1409 -- 56 -- -- -- --    Updated Vital Signs BP (!) 157/79 (BP Location: Left Arm)   Pulse (!) 55   Temp 97.9 F (36.6 C) (Oral)   Resp 16   Ht 5\' 6"  (1.676 m)   Wt 176 lb (79.8 kg)   SpO2 99%   BMI 28.41 kg/m   Visual Acuity Right Eye Distance:   Left Eye Distance:   Bilateral Distance:    Right Eye Near:   Left Eye Near:    Bilateral Near:     Physical Exam Constitutional:      Appearance: He is well-developed.  HENT:     Right Ear: Tympanic membrane and ear canal normal.     Left Ear: Tympanic membrane and ear canal normal.  Cardiovascular:     Rate and Rhythm: Bradycardia present.  Pulmonary:     Effort: Pulmonary effort is normal.  Skin:    General: Skin is warm and dry.  Neurological:     General: No focal deficit present.     Mental Status: He is alert and oriented to person, place, and time. Mental status is at baseline.     Cranial Nerves: No cranial nerve deficit.     Sensory: No sensory deficit.     Motor: No weakness.     Coordination: Coordination normal.     Gait: Gait normal.     Comments: Ambulatory without difficulty, getting up and down out of cart without difficulty or symptoms   EKG sinus bradycardia with RBB, rate of 52, appears unchanged from previous EKGS.   UC Treatments / Results  Labs (all labs ordered are listed, but only abnormal results are displayed) Labs Reviewed  CBC WITH DIFFERENTIAL/PLATELET  COMPREHENSIVE METABOLIC PANEL     EKG   Radiology No results found.  Procedures Procedures (including critical care time)  Medications Ordered in UC Medications - No data to display  Initial Impression / Assessment and Plan / UC Course  I have reviewed the triage vital signs and the nursing notes.  Pertinent labs & imaging results that were available during my care of the patient were reviewed by me and considered in my medical decision making (see chart for details).     Episode yesterday of dizziness with getting out of bed. No red flag symptoms associated with it. Feels well since. ekg unchanged. He is bradycardic, suspect positional change was source of dizziness. Fluids encouraged, and slow to rise out of bed. Return precautions provided. Patient verbalized understanding and agreeable to plan.  Ambulatory out of clinic without difficulty.    Final Clinical Impressions(s) / UC Diagnoses   Final diagnoses:  Vertigo  Bradycardia     Discharge Instructions     Please change positions slowly, particularly when raising from laying or sitting, to standing.  Drink plenty of water regularly, especially if you have drank caffeine.  Please follow up with your Primary care provider and cardiologist, especially if symptoms recur.  Go to the hospital for any worsening of symptoms- constant dizziness, headache, vision changes, fall, chest pain, sweating, or nausea  Cambie de posicin lentamente, especialmente cuando pase de estar acostado o sentado a estar de pie. Beba mucha agua regularmente, especialmente si ha bebido cafena. Haga un seguimiento con su proveedor de atencin primaria y , especialmente si los sntomas reaparecen.  Laurena BeringVaya al hospital por cualquier empeoramiento de los sntomas: mareos constantes, dolor de cabeza, cambios en la visin, cadas, dolor en el pecho, sudoracin o nuseas.   ED Prescriptions    None     PDMP not reviewed this encounter.   Georgetta HaberBurky, Ceira Hoeschen B, NP 08/29/20  1455

## 2020-08-29 NOTE — ED Triage Notes (Signed)
Patient states that when he went from sitting to standing he had an episode of dizziness that lasted 10 min yesterday morning.  Patient states that he has had no more episodes of dizziness.  Patient denies any headache or chest pain.

## 2020-08-29 NOTE — Discharge Instructions (Signed)
Please change positions slowly, particularly when raising from laying or sitting, to standing.  Drink plenty of water regularly, especially if you have drank caffeine.  Please follow up with your Primary care provider and cardiologist, especially if symptoms recur.  Go to the hospital for any worsening of symptoms- constant dizziness, headache, vision changes, fall, chest pain, sweating, or nausea  Cambie de posicin lentamente, especialmente cuando pase de estar acostado o sentado a estar de pie. Beba mucha agua regularmente, especialmente si ha bebido cafena. Haga un seguimiento con su proveedor de atencin primaria y Aeronautical engineer, especialmente si los sntomas reaparecen. Laurena Bering al hospital por cualquier empeoramiento de los sntomas: mareos constantes, dolor de cabeza, cambios en la visin, cadas, dolor en el pecho, sudoracin o nuseas.

## 2020-12-09 ENCOUNTER — Emergency Department
Admission: EM | Admit: 2020-12-09 | Discharge: 2020-12-09 | Disposition: A | Discharge status: Home / Self Care | Payer: Medicare Other | Attending: Emergency Medicine | Admitting: Emergency Medicine

## 2020-12-09 ENCOUNTER — Emergency Department: Payer: Medicare Other

## 2020-12-09 ENCOUNTER — Other Ambulatory Visit: Payer: Self-pay

## 2020-12-09 DIAGNOSIS — M545 Low back pain, unspecified: Secondary | ICD-10-CM | POA: Insufficient documentation

## 2020-12-09 DIAGNOSIS — M542 Cervicalgia: Secondary | ICD-10-CM | POA: Diagnosis not present

## 2020-12-09 DIAGNOSIS — E039 Hypothyroidism, unspecified: Secondary | ICD-10-CM | POA: Diagnosis not present

## 2020-12-09 DIAGNOSIS — Z7984 Long term (current) use of oral hypoglycemic drugs: Secondary | ICD-10-CM | POA: Insufficient documentation

## 2020-12-09 DIAGNOSIS — Z7902 Long term (current) use of antithrombotics/antiplatelets: Secondary | ICD-10-CM | POA: Diagnosis not present

## 2020-12-09 DIAGNOSIS — Z96651 Presence of right artificial knee joint: Secondary | ICD-10-CM | POA: Diagnosis not present

## 2020-12-09 DIAGNOSIS — I10 Essential (primary) hypertension: Secondary | ICD-10-CM | POA: Diagnosis not present

## 2020-12-09 DIAGNOSIS — Z79899 Other long term (current) drug therapy: Secondary | ICD-10-CM | POA: Diagnosis not present

## 2020-12-09 DIAGNOSIS — Z7982 Long term (current) use of aspirin: Secondary | ICD-10-CM | POA: Diagnosis not present

## 2020-12-09 LAB — CBC WITH DIFFERENTIAL/PLATELET
Abs Immature Granulocytes: 0.01 10*3/uL (ref 0.00–0.07)
Basophils Absolute: 0 10*3/uL (ref 0.0–0.1)
Basophils Relative: 1 %
Eosinophils Absolute: 0.1 10*3/uL (ref 0.0–0.5)
Eosinophils Relative: 2 %
HCT: 38.4 % — ABNORMAL LOW (ref 39.0–52.0)
Hemoglobin: 13 g/dL (ref 13.0–17.0)
Immature Granulocytes: 0 %
Lymphocytes Relative: 39 %
Lymphs Abs: 2 10*3/uL (ref 0.7–4.0)
MCH: 31.9 pg (ref 26.0–34.0)
MCHC: 33.9 g/dL (ref 30.0–36.0)
MCV: 94.3 fL (ref 80.0–100.0)
Monocytes Absolute: 0.5 10*3/uL (ref 0.1–1.0)
Monocytes Relative: 10 %
Neutro Abs: 2.5 10*3/uL (ref 1.7–7.7)
Neutrophils Relative %: 48 %
Platelets: 167 10*3/uL (ref 150–400)
RBC: 4.07 MIL/uL — ABNORMAL LOW (ref 4.22–5.81)
RDW: 13.1 % (ref 11.5–15.5)
WBC: 5.2 10*3/uL (ref 4.0–10.5)
nRBC: 0 % (ref 0.0–0.2)

## 2020-12-09 LAB — URINALYSIS, COMPLETE (UACMP) WITH MICROSCOPIC
Bilirubin Urine: NEGATIVE
Glucose, UA: NEGATIVE mg/dL
Hgb urine dipstick: NEGATIVE
Ketones, ur: NEGATIVE mg/dL
Nitrite: NEGATIVE
Protein, ur: NEGATIVE mg/dL
Specific Gravity, Urine: 1.004 — ABNORMAL LOW (ref 1.005–1.030)
pH: 7 (ref 5.0–8.0)

## 2020-12-09 LAB — COMPREHENSIVE METABOLIC PANEL
ALT: 11 U/L (ref 0–44)
AST: 27 U/L (ref 15–41)
Albumin: 4.4 g/dL (ref 3.5–5.0)
Alkaline Phosphatase: 90 U/L (ref 38–126)
Anion gap: 7 (ref 5–15)
BUN: 12 mg/dL (ref 8–23)
CO2: 26 mmol/L (ref 22–32)
Calcium: 8.9 mg/dL (ref 8.9–10.3)
Chloride: 100 mmol/L (ref 98–111)
Creatinine, Ser: 1.01 mg/dL (ref 0.61–1.24)
GFR, Estimated: >60 mL/min (ref >60)
Glucose, Bld: 109 mg/dL — ABNORMAL HIGH (ref 70–99)
Potassium: 3.8 mmol/L (ref 3.5–5.1)
Sodium: 133 mmol/L — ABNORMAL LOW (ref 135–145)
Total Bilirubin: 0.6 mg/dL (ref 0.3–1.2)
Total Protein: 7.9 g/dL (ref 6.5–8.1)

## 2020-12-09 LAB — LIPASE, BLOOD: Lipase: 32 U/L (ref 11–51)

## 2020-12-09 MED ORDER — CIPROFLOXACIN HCL 500 MG PO TABS: 500.0000 mg | ORAL_TABLET | Freq: Two times a day (BID) | ORAL | 0 refills | Status: AC

## 2020-12-09 NOTE — ED Provider Notes (Signed)
[facility]  ____________________________________________  Time seen: Approximately 3:31 PM  I have reviewed the triage vital signs and the nursing notes.   HISTORY  Chief Complaint Back Pain   Historian Patient    HPI Nayel Purdy is a 87 y.o. male with a history of hypertension, hyperlipidemia, seizures and hypothyroidism, presents to the emergency department with neck pain and low back pain.  Patient reports that he has had some intermittent pain in his legs but has no pain currently.  Patient has been able to stand and ambulate without difficulty.  Patient reports that he takes Flomax for BPH and has had diminished stream but denies dysuria, hematuria or fever.  No abdominal pain or nausea.  Patient denies recent falls or mechanisms of trauma.  No other alleviating measures have been attempted.   Past Medical History:  Diagnosis Date   GERD (gastroesophageal reflux disease)    Hyperlipidemia    Hypertension    Hypothyroidism    Seasonal allergies    Seizures (HCC)      Immunizations up to date:  Yes.     Past Medical History:  Diagnosis Date   GERD (gastroesophageal reflux disease)    Hyperlipidemia    Hypertension    Hypothyroidism    Seasonal allergies    Seizures [facility])     Patient Active Problem List   Diagnosis Date Noted   Rotator cuff tear arthropathy of right shoulder 12/18/2018    Past Surgical History:  Procedure Laterality Date   JOINT REPLACEMENT     right knee   REPLACEMENT TOTAL KNEE     right   TOTAL SHOULDER ARTHROPLASTY Right 12/18/2018   Procedure: TOTAL SHOULDER ARTHROPLASTY;  Surgeon: Lyndle Herrlich, MD;  Location: [facility] ORS;  Service: Orthopedics;  Laterality: Right;    Prior to Admission medications   Medication Sig Start Date End Date Taking? Authorizing Provider  ciprofloxacin (CIPRO) 500 MG tablet Take 1 tablet (500 mg total) by mouth 2 (two) times daily for 10 days. 12/09/20 12/19/20 Yes Pia Mau M, PA-C   albuterol (PROVENTIL) (2.5 MG/3ML) 0.083% nebulizer solution Take 2.5 mg by nebulization every 4 (four) hours as needed for wheezing or shortness of breath.    [provider]  albuterol (VENTOLIN HFA) 108 (90 Base) MCG/ACT inhaler Inhale 2 puffs into the lungs every 4 (four) hours as needed for wheezing or shortness of breath. Every 4-6 hours as needed for wheezing or cough    [provider]  amLODipine (NORVASC) 10 MG tablet Take 1 tablet by mouth daily. 11/17/14   [provider]  aspirin 81 MG chewable tablet Chew 81 mg by mouth daily.    [provider]  atorvastatin (LIPITOR) 80 MG tablet Take 1 tablet by mouth daily. 11/18/14   [provider]  bisacodyl (DULCOLAX) 5 MG EC tablet Take 5 mg by mouth daily as needed for moderate constipation. 1-3 as needed for constipation    [provider]  clopidogrel (PLAVIX) 75 MG tablet Take 75 mg by mouth daily.    [provider]  docusate sodium (COLACE) 100 MG capsule Take 100 mg by mouth 2 (two) times daily.    [provider]  fluticasone (FLONASE) 50 MCG/ACT nasal spray Place 2 sprays into both nostrils daily.    [provider]  furosemide (LASIX) 20 MG tablet Take 20 mg by mouth daily.    [provider]  hydrOXYzine (VISTARIL) 50 MG capsule Take 1 capsule (50 mg total) by mouth  3 (three) times daily as needed. 01/21/20   Cuthriell, Delorise Royals, PA-C  levETIRAcetam (KEPPRA) 500 MG tablet Take 5 tablets by mouth daily. 2 tabs in the AM and three in the PM 11/17/14   [provider]  levothyroxine (SYNTHROID) 112 MCG tablet Take 112 mcg by mouth daily before breakfast. 04/14/16   [provider]  Melatonin 1 MG TABS Take 5 mg by mouth at bedtime as needed.    [provider]  metFORMIN (GLUCOPHAGE) 500 MG tablet Take 500 mg by mouth 2 (two) times daily with a meal.    [provider]  metoprolol succinate (TOPROL-XL) 100 MG 24  hr tablet Take 50 mg by mouth daily.  11/17/14   [provider]  naphazoline-glycerin (CLEAR EYES REDNESS) 0.012-0.2 % SOLN 1-2 drops 4 (four) times daily as needed for eye irritation.    [provider]  naproxen (NAPROSYN) 500 MG tablet Take 500 mg by mouth 2 (two) times daily as needed.    [provider]  Olopatadine HCl (PATADAY) 0.2 % SOLN Apply to eye once. 1 gtt once daily    [provider]  omeprazole (PRILOSEC) 40 MG capsule Take 20 mg by mouth daily.  11/19/14   [provider]  phenytoin (DILANTIN) 100 MG ER capsule Take 3 capsules by mouth daily. 2 in AM and 1 at [facility] 11/17/14   [provider]  polyethylene glycol (MIRALAX / GLYCOLAX) 17 g packet Take 17 g by mouth daily.    [provider]  Propylene Glycol (SYSTANE BALANCE) 0.6 % SOLN Apply to eye daily as needed. 2-3 drops    [provider]  Skin Protectants, Misc. (EUCERIN) cream Apply topically as needed for dry skin. 01/21/20   Cuthriell, Delorise Royals, PA-C  tamsulosin (FLOMAX) 0.4 MG CAPS capsule Take 1 capsule by mouth daily. 11/17/14   [provider]  traZODone (DESYREL) 50 MG tablet Take 50 mg by mouth at bedtime as needed for sleep. 1/2 tab    [provider]  triamcinolone cream (KENALOG) 0.1 % Apply 1 application topically 4 (four) times daily. 01/21/20   Cuthriell, Delorise Royals, PA-C  triamcinolone cream (KENALOG) 0.5 % Apply 1 application topically daily.    [provider]  triamterene-hydrochlorothiazide (MAXZIDE-25) 37.5-25 MG per tablet Take 1 tablet by mouth daily. 11/19/14   [provider]  VASCEPA 1 G CAPS Take 2 tablets by mouth 2 (two) times a day.  11/18/14   [provider]    Allergies Patient has no known allergies.  No family history on file.  Social History Social History   Tobacco Use   Smoking status: Never   Smokeless tobacco: Never  Vaping Use   Vaping Use: Never used  Substance Use  Topics   Alcohol use: No   Drug use: Never     Review of Systems  Constitutional: No fever/chills Eyes:  No discharge ENT: No upper respiratory complaints. Respiratory: no cough. No SOB/ use of accessory muscles to breath Gastrointestinal:   No nausea, no vomiting.  No diarrhea.  No constipation. Musculoskeletal: Patient has low back pain and neck pain.  Skin: Negative for rash, abrasions, lacerations, ecchymosis.  ____________________________________________   PHYSICAL EXAM:  VITAL SIGNS: ED Triage Vitals  Enc Vitals Group     BP 12/09/20 1417 (!) 153/73     Pulse Rate 12/09/20 1417 86     Resp 12/09/20 1417 18     Temp 12/09/20 1417 98.1 F (36.7 C)  Temp Source 12/09/20 1417 Oral     SpO2 12/09/20 1417 98 %     Weight 12/09/20 1451 175 lb 14.8 oz (79.8 kg)     Height 12/09/20 1451 5\' 6"  (1.676 m)     Head Circumference --      Peak Flow --      Pain Score --      Pain Loc --      Pain Edu? --      Excl. in GC? --      Constitutional: Alert and oriented. Well appearing and in no acute distress. Eyes: Conjunctivae are normal. PERRL. EOMI. Head: Atraumatic. ENT:      Nose: No congestion/rhinnorhea.      Mouth/Throat: Mucous membranes are moist.  Neck: No stridor.  Full range of motion.  No midline C-spine tenderness to palpation.  Cardiovascular: Normal rate, regular rhythm. Normal S1 and S2.  Good peripheral circulation. Respiratory: Normal respiratory effort without tachypnea or retractions. Lungs CTAB. Good air entry to the bases with no decreased or absent breath sounds Gastrointestinal: Bowel sounds x 4 quadrants. Soft and nontender to palpation. No guarding or rigidity. No distention. Musculoskeletal: Full range of motion to all extremities. No obvious deformities noted patient has symmetric strength in the upper and lower extremities.  Patient does have midline lumbar spine tenderness around L3. Neurologic:  Normal for age. No gross focal neurologic  deficits are appreciated.  Skin:  Skin is warm, dry and intact. No rash noted. Psychiatric: Mood and affect are normal for age. Speech and behavior are normal.   ____________________________________________   LABS (all labs ordered are listed, but only abnormal results are displayed)  Labs Reviewed  CBC WITH DIFFERENTIAL/PLATELET - Abnormal; Notable for the following components:      Result Value   RBC 4.07 (*)    HCT 38.4 (*)    All other components within normal limits  COMPREHENSIVE METABOLIC PANEL - Abnormal; Notable for the following components:   Sodium 133 (*)    Glucose, Bld 109 (*)    All other components within normal limits  URINALYSIS, COMPLETE (UACMP) WITH MICROSCOPIC - Abnormal; Notable for the following components:   Color, Urine YELLOW (*)    APPearance CLEAR (*)    Specific Gravity, Urine 1.004 (*)    Leukocytes,Ua LARGE (*)    Bacteria, UA FEW (*)    All other components within normal limits  URINE CULTURE  LIPASE, BLOOD   ____________________________________________  EKG   ____________________________________________  RADIOLOGY , personally viewed and evaluated these images (plain radiographs) as part of my medical decision making, as well as reviewing the written report by the radiologist.    DG Lumbar Spine 2-3 Views  Result Date: 12/09/2020 CLINICAL DATA:  87 year old male with low back pain. EXAM: LUMBAR SPINE - 2-3 VIEW COMPARISON:  None. FINDINGS: Five lumbar type vertebra. There is no acute fracture or subluxation of the lumbar spine. The bones are osteopenic. Multilevel degenerative changes with disc space narrowing and disc desiccation. Mild chronic appearing compression fractures of L2-L4. The visualized posterior elements appear intact. There is atherosclerotic calcification of the abdominal aorta. The soft tissues are unremarkable. IMPRESSION: 1. No acute fracture or subluxation of the lumbar spine. 2. Osteopenia with  degenerative changes. Electronically Signed   By: 89 M.D.   On: 12/09/2020 16:07    ____________________________________________    PROCEDURES  Procedure(s) performed:     Procedures     Medications - No data  to display   ____________________________________________   INITIAL IMPRESSION / ASSESSMENT AND PLAN / ED COURSE  Pertinent labs & imaging results that were available during my care of the patient were reviewed by me and considered in my medical decision making (see chart for details).    Assessment and plan Low back pain Neck pain 87 year old male presents to the emergency department with acute low back pain and neck pain for the past 2 days.  Patient was hypertensive at triage but vitals were otherwise reassuring.  He had no neurodeficits on exam.  Abdomen was soft and nontender without guarding.  Patient had no CVA tenderness to palpation.  Patient did have midline lumbar spine tenderness at L3 and some paraspinal muscle tenderness along the cervical spine.  Patient was accompanied by his son who is requesting some basic labs.  Will obtain basic labs, lumbar spine x-ray and urinalysis and will reassess.  Urinalysis is concerning for UTI with a large amount of leukocytes and few bacteria.  Urine culture is in process at this time.  CBC and CMP were reassuring.  X-ray of the lumbar spine showed no acute bony abnormality.  Recommended ciprofloxacin twice daily for the next 10 days.  Return precautions were given to return with new or worsening symptoms.  All patient questions were answered.     ____________________________________________  FINAL CLINICAL IMPRESSION(S) / ED DIAGNOSES  Final diagnoses:  Acute bilateral low back pain without sciatica      NEW MEDICATIONS STARTED DURING THIS VISIT:  ED Discharge Orders          Ordered    ciprofloxacin (CIPRO) 500 MG tablet  2 times daily        12/09/20 1711                This  chart was dictated using voice recognition software/Dragon. Despite best efforts to proofread, errors can occur which can change the meaning. Any change was purely unintentional.     Orvil FeilWoods, Devony Mcgrady M, PA-C 12/09/20 [id number]    Smith, Dylan, MD 12/09/20 [phone]

## 2020-12-09 NOTE — ED Triage Notes (Signed)
Per pt son, pt c/o lower back pain radiating into BL LE , hx of sciatica

## 2020-12-09 NOTE — ED Notes (Signed)
See triage note  Presents with lower back pain which started about 1 week ago  Hx of same  Denies any injury  States heating pad helps the pain  Ambulates with cane

## 2020-12-09 NOTE — ED Notes (Signed)
Patient transported to X-ray 

## 2020-12-09 NOTE — ED Notes (Signed)
Pt in XR. 

## 2020-12-09 NOTE — Discharge Instructions (Addendum)
Take ciprofloxacin twice daily for the next 10 days. You can take [employer] brand probiotic which is sold at [employer].

## 2020-12-12 LAB — URINE CULTURE: Culture: >=100000 — AB

## 2021-04-05 ENCOUNTER — Emergency Department
Admission: EM | Admit: 2021-04-05 | Discharge: 2021-04-05 | Disposition: A | Discharge status: Home / Self Care | Payer: Medicare Other | Attending: Emergency Medicine | Admitting: Emergency Medicine

## 2021-04-05 ENCOUNTER — Emergency Department: Payer: Medicare Other

## 2021-04-05 ENCOUNTER — Other Ambulatory Visit: Payer: Self-pay

## 2021-04-05 ENCOUNTER — Encounter: Payer: Self-pay | Admitting: Emergency Medicine

## 2021-04-05 DIAGNOSIS — I509 Heart failure, unspecified: Secondary | ICD-10-CM | POA: Insufficient documentation

## 2021-04-05 DIAGNOSIS — I10 Essential (primary) hypertension: Secondary | ICD-10-CM | POA: Diagnosis not present

## 2021-04-05 DIAGNOSIS — Z7984 Long term (current) use of oral hypoglycemic drugs: Secondary | ICD-10-CM | POA: Diagnosis not present

## 2021-04-05 DIAGNOSIS — R001 Bradycardia, unspecified: Secondary | ICD-10-CM | POA: Insufficient documentation

## 2021-04-05 DIAGNOSIS — R519 Headache, unspecified: Secondary | ICD-10-CM | POA: Diagnosis not present

## 2021-04-05 DIAGNOSIS — Z96651 Presence of right artificial knee joint: Secondary | ICD-10-CM | POA: Diagnosis not present

## 2021-04-05 DIAGNOSIS — Z7902 Long term (current) use of antithrombotics/antiplatelets: Secondary | ICD-10-CM | POA: Diagnosis not present

## 2021-04-05 DIAGNOSIS — Z96611 Presence of right artificial shoulder joint: Secondary | ICD-10-CM | POA: Diagnosis not present

## 2021-04-05 DIAGNOSIS — Z7951 Long term (current) use of inhaled steroids: Secondary | ICD-10-CM | POA: Insufficient documentation

## 2021-04-05 DIAGNOSIS — E039 Hypothyroidism, unspecified: Secondary | ICD-10-CM | POA: Insufficient documentation

## 2021-04-05 DIAGNOSIS — R0789 Other chest pain: Secondary | ICD-10-CM | POA: Diagnosis not present

## 2021-04-05 DIAGNOSIS — I160 Hypertensive urgency: Secondary | ICD-10-CM

## 2021-04-05 DIAGNOSIS — Z7982 Long term (current) use of aspirin: Secondary | ICD-10-CM | POA: Diagnosis not present

## 2021-04-05 DIAGNOSIS — R2243 Localized swelling, mass and lump, lower limb, bilateral: Secondary | ICD-10-CM | POA: Insufficient documentation

## 2021-04-05 DIAGNOSIS — R079 Chest pain, unspecified: Secondary | ICD-10-CM

## 2021-04-05 DIAGNOSIS — Z20822 Contact with and (suspected) exposure to covid-19: Secondary | ICD-10-CM | POA: Insufficient documentation

## 2021-04-05 DIAGNOSIS — I110 Hypertensive heart disease with heart failure: Secondary | ICD-10-CM | POA: Insufficient documentation

## 2021-04-05 DIAGNOSIS — R0602 Shortness of breath: Secondary | ICD-10-CM | POA: Insufficient documentation

## 2021-04-05 LAB — BASIC METABOLIC PANEL
Anion gap: 9 (ref 5–15)
BUN: 16 mg/dL (ref 8–23)
CO2: 27 mmol/L (ref 22–32)
Calcium: 8.7 mg/dL — ABNORMAL LOW (ref 8.9–10.3)
Chloride: 97 mmol/L — ABNORMAL LOW (ref 98–111)
Creatinine, Ser: 1.09 mg/dL (ref 0.61–1.24)
GFR, Estimated: >60 mL/min (ref >60)
Glucose, Bld: 131 mg/dL — ABNORMAL HIGH (ref 70–99)
Potassium: 3.5 mmol/L (ref 3.5–5.1)
Sodium: 133 mmol/L — ABNORMAL LOW (ref 135–145)

## 2021-04-05 LAB — CBC
HCT: 37.9 % — ABNORMAL LOW (ref 39.0–52.0)
Hemoglobin: 13.4 g/dL (ref 13.0–17.0)
MCH: 32.7 pg (ref 26.0–34.0)
MCHC: 35.4 g/dL (ref 30.0–36.0)
MCV: 92.4 fL (ref 80.0–100.0)
Platelets: 162 10*3/uL (ref 150–400)
RBC: 4.1 MIL/uL — ABNORMAL LOW (ref 4.22–5.81)
RDW: 12.6 % (ref 11.5–15.5)
WBC: 3.8 10*3/uL — ABNORMAL LOW (ref 4.0–10.5)
nRBC: 0 % (ref 0.0–0.2)

## 2021-04-05 LAB — RESP PANEL BY RT-PCR (FLU A&B, COVID) ARPGX2
Influenza A by PCR: NEGATIVE
Influenza B by PCR: NEGATIVE
SARS Coronavirus 2 by RT PCR: NEGATIVE

## 2021-04-05 LAB — TROPONIN I (HIGH SENSITIVITY)
Troponin I (High Sensitivity): 9 ng/L (ref <18)
Troponin I (High Sensitivity): 9 ng/L (ref <18)

## 2021-04-05 MED ORDER — HYDROCHLOROTHIAZIDE 25 MG PO TABS
25.0000 mg | ORAL_TABLET | ORAL | Status: AC
  Administered 2021-04-05: 25 mg via ORAL
  Filled 2021-04-05: qty 1

## 2021-04-05 MED ORDER — PROCHLORPERAZINE EDISYLATE 10 MG/2ML IJ SOLN: 10.0000 mg | Freq: Once | INTRAMUSCULAR | Status: DC

## 2021-04-05 MED ORDER — AMLODIPINE BESYLATE 5 MG PO TABS
10.0000 mg | ORAL_TABLET | ORAL | Status: AC
  Administered 2021-04-05: 10 mg via ORAL
  Filled 2021-04-05: qty 2

## 2021-04-05 NOTE — ED Provider Notes (Signed)
[facility] Emergency Department Provider Note  ____________________________________________   Event Date/Time   First MD Initiated Contact with Patient 04/05/21 1541     (approximate)  I have reviewed the triage vital signs and the nursing notes.   HISTORY  Chief Complaint Chest Pain and Shortness of Breath   HPI Kevin Burns is a 87 y.o. male with a past medical history of HTN, HDL, hypothyroidism, seizure disorder, GERD and mild CHF on 20 mg of Lasix daily who presents accompanied by son for assessment of some left-sided chest pain following patient on and off for the past 2 weeks as well as 3 days of headache and about 1 to 2 weeks of some shortness of breath and mild nonproductive cough.  Patient does not have any vision changes, earache, sore throat, fevers, vomiting, diarrhea, burning with urination, abdominal pain, back pain rash or acute extremity pain.  No vertigo or head injuries.  He states he has been compliant with all his medications.  No tobacco abuse, EtOH use or illicit drug use.  He states he sleeps on the side and thinks he may have gained a little weight last couple days.  No other acute concerns at this time.         Past Medical History:  Diagnosis Date   GERD (gastroesophageal reflux disease)    Hyperlipidemia    Hypertension    Hypothyroidism    Seasonal allergies    Seizures [facility])     Patient Active Problem List   Diagnosis Date Noted   Rotator cuff tear arthropathy of right shoulder 12/18/2018    Past Surgical History:  Procedure Laterality Date   JOINT REPLACEMENT     right knee   REPLACEMENT TOTAL KNEE     right   TOTAL SHOULDER ARTHROPLASTY Right 12/18/2018   Procedure: TOTAL SHOULDER ARTHROPLASTY;  Surgeon: Lyndle Herrlich, MD;  Location: [facility] ORS;  Service: Orthopedics;  Laterality: Right;    Prior to Admission medications   Medication Sig Start Date End Date Taking? Authorizing Provider  albuterol (PROVENTIL)  (2.5 MG/3ML) 0.083% nebulizer solution Take 2.5 mg by nebulization every 4 (four) hours as needed for wheezing or shortness of breath.    [provider]  albuterol (VENTOLIN HFA) 108 (90 Base) MCG/ACT inhaler Inhale 2 puffs into the lungs every 4 (four) hours as needed for wheezing or shortness of breath. Every 4-6 hours as needed for wheezing or cough    [provider]  amLODipine (NORVASC) 10 MG tablet Take 1 tablet by mouth daily. 11/17/14   [provider]  aspirin 81 MG chewable tablet Chew 81 mg by mouth daily.    [provider]  atorvastatin (LIPITOR) 80 MG tablet Take 1 tablet by mouth daily. 11/18/14   [provider]  bisacodyl (DULCOLAX) 5 MG EC tablet Take 5 mg by mouth daily as needed for moderate constipation. 1-3 as needed for constipation    [provider]  clopidogrel (PLAVIX) 75 MG tablet Take 75 mg by mouth daily.    [provider]  docusate sodium (COLACE) 100 MG capsule Take 100 mg by mouth 2 (two) times daily.    [provider]  fluticasone (FLONASE) 50 MCG/ACT nasal spray Place 2 sprays into both nostrils daily.    [provider]  furosemide (LASIX) 20 MG tablet Take 20 mg by mouth daily.    [provider]  hydrOXYzine (VISTARIL) 50 MG capsule Take 1 capsule (50 mg total) by  mouth 3 (three) times daily as needed. 01/21/20   Cuthriell, Delorise Royals, PA-C  levETIRAcetam (KEPPRA) 500 MG tablet Take 5 tablets by mouth daily. 2 tabs in the AM and three in the PM 11/17/14   [provider]  levothyroxine (SYNTHROID) 112 MCG tablet Take 112 mcg by mouth daily before breakfast. 04/14/16   [provider]  Melatonin 1 MG TABS Take 5 mg by mouth at bedtime as needed.    [provider]  metFORMIN (GLUCOPHAGE) 500 MG tablet Take 500 mg by mouth 2 (two) times daily with a meal.    [provider]  metoprolol succinate (TOPROL-XL) 100 MG 24 hr tablet Take 50 mg by  mouth daily.  11/17/14   [provider]  naphazoline-glycerin (CLEAR EYES REDNESS) 0.012-0.2 % SOLN 1-2 drops 4 (four) times daily as needed for eye irritation.    [provider]  naproxen (NAPROSYN) 500 MG tablet Take 500 mg by mouth 2 (two) times daily as needed.    [provider]  Olopatadine HCl (PATADAY) 0.2 % SOLN Apply to eye once. 1 gtt once daily    [provider]  omeprazole (PRILOSEC) 40 MG capsule Take 20 mg by mouth daily.  11/19/14   [provider]  phenytoin (DILANTIN) 100 MG ER capsule Take 3 capsules by mouth daily. 2 in AM and 1 at [facility] 11/17/14   [provider]  polyethylene glycol (MIRALAX / GLYCOLAX) 17 g packet Take 17 g by mouth daily.    [provider]  Propylene Glycol (SYSTANE BALANCE) 0.6 % SOLN Apply to eye daily as needed. 2-3 drops    [provider]  Skin Protectants, Misc. (EUCERIN) cream Apply topically as needed for dry skin. 01/21/20   Cuthriell, Delorise Royals, PA-C  tamsulosin (FLOMAX) 0.4 MG CAPS capsule Take 1 capsule by mouth daily. 11/17/14   [provider]  traZODone (DESYREL) 50 MG tablet Take 50 mg by mouth at bedtime as needed for sleep. 1/2 tab    [provider]  triamcinolone cream (KENALOG) 0.1 % Apply 1 application topically 4 (four) times daily. 01/21/20   Cuthriell, Delorise Royals, PA-C  triamcinolone cream (KENALOG) 0.5 % Apply 1 application topically daily.    [provider]  triamterene-hydrochlorothiazide (MAXZIDE-25) 37.5-25 MG per tablet Take 1 tablet by mouth daily. 11/19/14   [provider]  VASCEPA 1 G CAPS Take 2 tablets by mouth 2 (two) times a day.  11/18/14   [provider]    Allergies Patient has no known allergies.  History reviewed. No pertinent family history.  Social History Social History   Tobacco Use   Smoking status: Never   Smokeless tobacco: Never  Vaping Use   Vaping Use: Never used  Substance Use  Topics   Alcohol use: No   Drug use: Never    Review of Systems  Review of Systems  Constitutional:  Negative for chills and fever.  HENT:  Negative for sore throat.   Eyes:  Negative for pain.  Respiratory:  Positive for cough and shortness of breath. Negative for stridor.   Cardiovascular:  Positive for chest pain.  Gastrointestinal:  Negative for vomiting.  Genitourinary:  Negative for dysuria.  Musculoskeletal:  Negative for myalgias.  Skin:  Negative for rash.  Neurological:  Positive for headaches. Negative for seizures and loss of consciousness.  Psychiatric/Behavioral:  Negative for suicidal ideas.   All other systems reviewed and are negative.    ____________________________________________  PHYSICAL EXAM:  VITAL SIGNS: ED Triage Vitals  Enc Vitals Group     BP 04/05/21 1258 (!) 178/95     Pulse Rate 04/05/21 1258 (!) 58     Resp 04/05/21 1258 18     Temp 04/05/21 1258 98.5 F (36.9 C)     Temp Source 04/05/21 1258 Oral     SpO2 04/05/21 1258 100 %     Weight 04/05/21 1259 200 lb (90.7 kg)     Height 04/05/21 1259 5\' 5"  (1.651 m)     Head Circumference --      Peak Flow --      Pain Score 04/05/21 1258 5     Pain Loc --      Pain Edu? --      Excl. in GC? --    Vitals:   04/05/21 1555 04/05/21 1732  BP: (!) 182/81 (!) 185/97  Pulse: (!) 50   Resp: 18   Temp: 98.5 F (36.9 C)   SpO2: 100%    Physical Exam Vitals and nursing note reviewed.  Constitutional:      Appearance: He is well-developed. He is obese.  HENT:     Head: Normocephalic and atraumatic.     Right Ear: External ear normal.     Left Ear: External ear normal.     Nose: Nose normal.     Mouth/Throat:     Mouth: Mucous membranes are moist.  Eyes:     Conjunctiva/sclera: Conjunctivae normal.  Cardiovascular:     Rate and Rhythm: Regular rhythm. Bradycardia present.     Heart sounds: No murmur heard. Pulmonary:     Effort: Pulmonary effort is normal. No respiratory distress.      Breath sounds: Normal breath sounds.  Abdominal:     Palpations: Abdomen is soft.     Tenderness: There is no abdominal tenderness.  Musculoskeletal:     Cervical back: Neck supple.     Right lower leg: Edema present.     Left lower leg: Edema present.  Skin:    General: Skin is warm and dry.     Capillary Refill: Capillary refill takes less than 2 seconds.  Neurological:     Mental Status: He is alert and oriented to person, place, and time.    Cranial nerves II through XII grossly intact.  No pronator drift.  No finger dysmetria.  Symmetric 5/5 strength of all extremities.  Sensation intact to light touch in all extremities.    ____________________________________________   LABS (all labs ordered are listed, but only abnormal results are displayed)  Labs Reviewed  BASIC METABOLIC PANEL - Abnormal; Notable for the following components:      Result Value   Sodium 133 (*)    Chloride 97 (*)    Glucose, Bld 131 (*)    Calcium 8.7 (*)    All other components within normal limits  CBC - Abnormal; Notable for the following components:   WBC 3.8 (*)    RBC 4.10 (*)    HCT 37.9 (*)    All other components within normal limits  RESP PANEL BY RT-PCR (FLU A&B, COVID) ARPGX2  TROPONIN I (HIGH SENSITIVITY)  TROPONIN I (HIGH SENSITIVITY)   ____________________________________________  EKG  ECG shows sinus bradycardia with a ventricular rate of 58, right bundle branch block and some nonspecific ST changes in lateral leads without other clear evidence of acute ischemia or significant arrhythmia.  Otherwise unremarkable intervals. ____________________________________________  RADIOLOGY  ED MD interpretation:  Chest x-ray has no focal consolidation, effusion, overt edema, pneumothorax or other clear acute thoracic process.  Official radiology report(s): DG Chest 2 View  Result Date: 04/05/2021 CLINICAL DATA:  Centralized chest pain radiating up neck EXAM: CHEST - 2 VIEW  COMPARISON:  Chest radiograph 04/02/2016 FINDINGS: The cardiomediastinal silhouette is normal. There is no focal consolidation or pulmonary edema. There is no pleural effusion or pneumothorax. Right shoulder arthroplasty hardware is noted. There is no acute osseous abnormality. IMPRESSION: No radiographic evidence of acute cardiopulmonary process. Electronically Signed   By: Lesia Hausen M.D.   On: 04/05/2021 13:40   CT HEAD WO CONTRAST ( )  Result Date: 04/05/2021 CLINICAL DATA:  Headache, intracranial hemorrhage suspected EXAM: CT HEAD WITHOUT CONTRAST TECHNIQUE: Contiguous axial images were obtained from the base of the skull through the vertex without intravenous contrast. COMPARISON:  MRI head 12/04/2016, CT head 07/21/2010. FINDINGS: Brain: No evidence of acute intracranial hemorrhage or extra-axial collection.No evidence of mass lesion/concern mass effect.The ventricles are normal in size.Scattered subcortical and periventricular white matter hypodensities, nonspecific but likely sequela of chronic small vessel ischemic disease. Vascular: No hyperdense vessel or unexpected calcification. Skull: Normal. Negative for fracture or focal lesion. Sinuses/Orbits: No acute finding. Other: None. IMPRESSION: No acute intracranial abnormality. Electronically Signed   By: Caprice Renshaw M.D.   On: 04/05/2021 17:32    ____________________________________________   PROCEDURES  Procedure(s) performed (including Critical Care):  Procedures   ____________________________________________   INITIAL IMPRESSION / ASSESSMENT AND PLAN / ED COURSE      Patient presents with above-stated history exam for assessment of several complaints including headache some intermittent left-sided chest discomfort on and off for the last 2 weeks also some shortness of breath over the last couple weeks.  On arrival he is hypertensive with BP 178/95 and slight bradycardic at 58 with otherwise stable vital signs on room air.   Some lower extremity edema exam but no significant abnormal breath sounds and is not hypoxic or in evidence of respiratory distress.  He has a nonfocal neurological exam and is not encephalopathic or altered.  Overall I suspect likely symptomatic hypertensive urgency.  He has no findings on exam to suggest CVA or head CT to suggest intracranial bleeding or other acute cranial process.  Patient does have some edema on exam in the lower extremities he has no evidence of respiratory distress hypoxia or abnormal breath sounds or findings on x-ray to suggest acute significant volume overload.  There is no evidence of pneumonia pneumothorax or other acute thoracic process.  Chest x-ray has no focal consolidation, effusion, overt edema, pneumothorax or other clear acute thoracic process.  EKG and nonelevated troponin x2 are not consistent with ACS myocarditis or significant arrhythmia.  CBC shows WBC count 3.8 and no evidence of acute anemia and normal platelets.  BMP shows no significant electrolyte metabolic derangements.  Patient given dose of home blood pressure medicines with improvement his blood pressure to systolics in the 160s and on reassessment stated his headache and chest pain had resolved.  States his breathing feels much better.  Given improvement in blood pressure and symptoms with otherwise reassuring exam and work-up I think patient is stable for discharge with close outpatient PCP follow-up for further titration of his blood pressure medicines.  Also advised patient and family at bedside that patient likely benefit from cardiac stress testing as its been several years since his last stress test.  They are amenable with plan.  Discharged stable condition.  Strict return  precautions advised and discussed.     ____________________________________________   FINAL CLINICAL IMPRESSION(S) / ED DIAGNOSES  Final diagnoses:  Hypertensive urgency  Nonintractable headache, unspecified chronicity  pattern, unspecified headache type  Chest pain, unspecified type    Medications  amLODipine (NORVASC) tablet 10 mg (10 mg Oral Given 04/05/21 1732)  hydrochlorothiazide (HYDRODIURIL) tablet 25 mg (25 mg Oral Given 04/05/21 1733)     ED Discharge Orders     None        Note:  This document was prepared using Dragon voice recognition software and may include unintentional dictation errors.    Gilles Chiquito, MD 04/05/21 [phone]

## 2021-04-05 NOTE — ED Triage Notes (Signed)
Pt via POV from home. Pt c/o centralized CP that radiates up his neck for the past 3 days. Pt also c/o SOB. Pt is A&Ox4 and NAD. Denies significant cardiac hx.

## 2021-04-05 NOTE — ED Notes (Signed)
Pt verbalized understanding of dc instructions. No questions or concerns at this time. Pt very thankful for care provided from all staff. Refuses wheelchair, ambulated with steady gait with son at side to lobby.

## 2021-04-12 ENCOUNTER — Other Ambulatory Visit: Payer: Self-pay

## 2021-04-12 ENCOUNTER — Emergency Department: Payer: Medicare Other

## 2021-04-12 ENCOUNTER — Emergency Department
Admission: EM | Admit: 2021-04-12 | Discharge: 2021-04-12 | Disposition: A | Discharge status: Home / Self Care | Payer: Medicare Other | Attending: Emergency Medicine | Admitting: Emergency Medicine

## 2021-04-12 DIAGNOSIS — R079 Chest pain, unspecified: Secondary | ICD-10-CM | POA: Diagnosis present

## 2021-04-12 DIAGNOSIS — I10 Essential (primary) hypertension: Secondary | ICD-10-CM | POA: Diagnosis not present

## 2021-04-12 DIAGNOSIS — Z7902 Long term (current) use of antithrombotics/antiplatelets: Secondary | ICD-10-CM | POA: Insufficient documentation

## 2021-04-12 DIAGNOSIS — R0789 Other chest pain: Secondary | ICD-10-CM | POA: Insufficient documentation

## 2021-04-12 DIAGNOSIS — E039 Hypothyroidism, unspecified: Secondary | ICD-10-CM | POA: Insufficient documentation

## 2021-04-12 DIAGNOSIS — Z79899 Other long term (current) drug therapy: Secondary | ICD-10-CM | POA: Diagnosis not present

## 2021-04-12 DIAGNOSIS — Z96651 Presence of right artificial knee joint: Secondary | ICD-10-CM | POA: Insufficient documentation

## 2021-04-12 DIAGNOSIS — Z96611 Presence of right artificial shoulder joint: Secondary | ICD-10-CM | POA: Diagnosis not present

## 2021-04-12 DIAGNOSIS — Z7982 Long term (current) use of aspirin: Secondary | ICD-10-CM | POA: Diagnosis not present

## 2021-04-12 LAB — BASIC METABOLIC PANEL
Anion gap: 10 (ref 5–15)
BUN: 16 mg/dL (ref 8–23)
CO2: 25 mmol/L (ref 22–32)
Calcium: 9.2 mg/dL (ref 8.9–10.3)
Chloride: 99 mmol/L (ref 98–111)
Creatinine, Ser: 1.04 mg/dL (ref 0.61–1.24)
GFR, Estimated: >60 mL/min (ref >60)
Glucose, Bld: 145 mg/dL — ABNORMAL HIGH (ref 70–99)
Potassium: 4.3 mmol/L (ref 3.5–5.1)
Sodium: 134 mmol/L — ABNORMAL LOW (ref 135–145)

## 2021-04-12 LAB — CBC
HCT: 37.5 % — ABNORMAL LOW (ref 39.0–52.0)
Hemoglobin: 13.2 g/dL (ref 13.0–17.0)
MCH: 33.1 pg (ref 26.0–34.0)
MCHC: 35.2 g/dL (ref 30.0–36.0)
MCV: 94 fL (ref 80.0–100.0)
Platelets: 171 10*3/uL (ref 150–400)
RBC: 3.99 MIL/uL — ABNORMAL LOW (ref 4.22–5.81)
RDW: 12.7 % (ref 11.5–15.5)
WBC: 4 10*3/uL (ref 4.0–10.5)
nRBC: 0 % (ref 0.0–0.2)

## 2021-04-12 LAB — TROPONIN I (HIGH SENSITIVITY)
Troponin I (High Sensitivity): 8 ng/L (ref <18)
Troponin I (High Sensitivity): 10 ng/L (ref <18)

## 2021-04-12 NOTE — Discharge Instructions (Signed)
All the cardiologist to be seen in the clinic.   Check your blood pressure occasionally when you feel normal at home.   If you have chest pain that will not go away, chest pain with walking, short of breath with walking, return to the ED.

## 2021-04-12 NOTE — ED Provider Notes (Signed)
Emergency Medicine Provider Triage Evaluation Note  Kevin Burns, a 87 y.o. male  was evaluated in triage.  Pt complains of chest pain.  Patient presents with his son who is his primary caregiver, with complaints of left-sided chest pain that relates to the left arm.  Patient reports onset of shortness of breath and feeling hot overnight.  Denies any cough, or congestion.   Review of Systems  Positive: CP, SOB Negative: FCS  Physical Exam  BP (!) 156/62 (BP Location: Left Arm)   Pulse (!) 50   Temp 97.8 F (36.6 C) (Oral)   Resp 18   Ht 5\' 5"  (1.651 m)   Wt 90.7 kg   SpO2 100%   BMI 33.27 kg/m  Gen:   Awake, no distress  NAD Resp:  Normal effort CTA MSK:   Moves extremities without difficulty  Other:  CVS: RRR  Medical Decision Making  Medically screening exam initiated at 12:04 PM.  Appropriate orders placed.  Kevin Burns was informed that the remainder of the evaluation will be completed by another provider, this initial triage assessment does not replace that evaluation, and the importance of remaining in the ED until their evaluation is complete.  Geriatric patient ED evaluation of left-sided chest pain and shortness of breath onset last night.   Kevin Capers, PA-C 04/12/21 1206    04/14/21, MD 04/13/21 1200

## 2021-04-12 NOTE — ED Provider Notes (Signed)
[facility] Provider Note ____________________________________________   Event Date/Time   First MD Initiated Contact with Patient 04/12/21 1719     (approximate)  I have reviewed the triage vital signs and the nursing notes.  HISTORY  Chief Complaint Chest Pain   HPI Kevin Burns is a 87 y.o. malewho presents to the ED for evaluation of chest pain.   Chart review indicates hx HTN, HLD, hypothyroidism.  No known cardiac hx.   Patient presents to the ED, accompanied by his son, for evaluation of intermittent nightly chest pains.  He reports no pain throughout the day, no pain with exertion and no dyspnea on exertion.  But reports that when he is at night, alone and laying in bed, he will develop chest pain and fluttering.  He reports this precipitates anxiety and a sensation of shortness of breath, so he will check his blood pressure and it will be elevated to the 160s-180s systolic.  Patient reports an episode such as this that occurred last night, and has had a handful of these in the past 2 weeks.  He denies any chest pain right now and reports feeling fine.  Past Medical History:  Diagnosis Date   GERD (gastroesophageal reflux disease)    Hyperlipidemia    Hypertension    Hypothyroidism    Seasonal allergies    Seizures [facility])     Patient Active Problem List   Diagnosis Date Noted   Rotator cuff tear arthropathy of right shoulder 12/18/2018    Past Surgical History:  Procedure Laterality Date   JOINT REPLACEMENT     right knee   REPLACEMENT TOTAL KNEE     right   TOTAL SHOULDER ARTHROPLASTY Right 12/18/2018   Procedure: TOTAL SHOULDER ARTHROPLASTY;  Surgeon: Lyndle Herrlich, MD;  Location: [facility] ORS;  Service: Orthopedics;  Laterality: Right;    Prior to Admission medications   Medication Sig Start Date End Date Taking? Authorizing Provider  albuterol (VENTOLIN HFA) 108 (90 Base) MCG/ACT inhaler Inhale 2 puffs into the  lungs every 4 (four) hours as needed for wheezing or shortness of breath. Every 4-6 hours as needed for wheezing or cough   Yes [provider]  amLODipine (NORVASC) 10 MG tablet Take 1 tablet by mouth daily. 11/17/14  Yes [provider]  aspirin 81 MG chewable tablet Chew 81 mg by mouth daily.   Yes [provider]  atorvastatin (LIPITOR) 80 MG tablet Take 1 tablet by mouth daily. 11/18/14  Yes [provider]  bisacodyl (DULCOLAX) 5 MG EC tablet Take 5 mg by mouth daily as needed for moderate constipation. 1-3 as needed for constipation   Yes [provider]  clopidogrel (PLAVIX) 75 MG tablet Take 75 mg by mouth daily.   Yes [provider]  docusate sodium (COLACE) 100 MG capsule Take 100 mg by mouth 2 (two) times daily.   Yes [provider]  fluticasone (FLONASE) 50 MCG/ACT nasal spray Place 2 sprays into both nostrils daily.   Yes [provider]  furosemide (LASIX) 20 MG tablet Take 20 mg by mouth daily.   Yes [provider]  hydrOXYzine (VISTARIL) 50 MG capsule Take 1 capsule (50 mg total) by mouth 3 (three) times daily as needed. 01/21/20  Yes Cuthriell, Delorise Royals, PA-C  levETIRAcetam (KEPPRA) 500 MG tablet Take 5 tablets by mouth daily. 2 tabs in the AM and three in the PM 11/17/14  Yes [provider]  levothyroxine (SYNTHROID) 112  MCG tablet Take 112 mcg by mouth daily before breakfast. 04/14/16  Yes [provider]  Melatonin 1 MG TABS Take 5 mg by mouth at bedtime as needed.   Yes [provider]  metFORMIN (GLUCOPHAGE) 500 MG tablet Take 500 mg by mouth 2 (two) times daily with a meal.   Yes [provider]  metoprolol succinate (TOPROL-XL) 100 MG 24 hr tablet Take 50 mg by mouth daily.  11/17/14  Yes [provider]  naphazoline-glycerin (CLEAR EYES REDNESS) 0.012-0.2 % SOLN 1-2 drops 4 (four) times daily as needed for eye irritation.   Yes [provider]  naproxen (NAPROSYN) 500 MG tablet Take 500 mg by mouth 2 (two) times daily as needed.   Yes [provider]  Olopatadine HCl 0.2 % SOLN Apply to eye once. 1 gtt once daily   Yes [provider]  omeprazole (PRILOSEC) 40 MG capsule Take 20 mg by mouth daily.  11/19/14  Yes [provider]  phenytoin (DILANTIN) 100 MG ER capsule Take 3 capsules by mouth daily. 2 in AM and 1 at [facility] 11/17/14  Yes [provider]  polyethylene glycol (MIRALAX / GLYCOLAX) 17 g packet Take 17 g by mouth daily.   Yes [provider]  Propylene Glycol (SYSTANE BALANCE) 0.6 % SOLN Apply to eye daily as needed. 2-3 drops   Yes [provider]  Skin Protectants, Misc. (EUCERIN) cream Apply topically as needed for dry skin. 01/21/20  Yes Cuthriell, Delorise Royals, PA-C  tamsulosin (FLOMAX) 0.4 MG CAPS capsule Take 1 capsule by mouth daily. 11/17/14  Yes [provider]  traZODone (DESYREL) 50 MG tablet Take 50 mg by mouth at bedtime as needed for sleep. 1/2 tab   Yes [provider]  triamcinolone cream (KENALOG) 0.1 % Apply 1 application topically 4 (four) times daily. 01/21/20  Yes Cuthriell, Delorise Royals, PA-C  triamcinolone cream (KENALOG) 0.5 % Apply 1 application topically daily.   Yes [provider]  triamterene-hydrochlorothiazide (MAXZIDE-25) 37.5-25 MG per tablet Take 1 tablet by mouth daily. 11/19/14  Yes [provider]  VASCEPA 1 G CAPS Take 2 tablets by mouth 2 (two) times a day.  11/18/14  Yes [provider]  albuterol (PROVENTIL) (2.5 MG/3ML) 0.083% nebulizer solution Take 2.5 mg by nebulization every 4 (four) hours as needed for wheezing or shortness of breath. Patient not taking: No sig reported    [provider]    Allergies Patient has no known allergies.  No family history on file.  Social History Social History   Tobacco Use   Smoking status: Never   Smokeless tobacco: Never  Vaping Use   Vaping  Use: Never used  Substance Use Topics   Alcohol use: No   Drug use: Never    Review of Systems  Constitutional: No fever/chills Eyes: No visual changes. ENT: No sore throat. Cardiovascular: Positive for chest pain. Respiratory: Positive for shortness of breath. Gastrointestinal: No abdominal pain.  No nausea, no vomiting.  No diarrhea.  No constipation. Genitourinary: Negative for dysuria. Musculoskeletal: Negative for back pain. Skin: Negative for rash. Neurological: Negative for headaches, focal weakness or numbness.  ____________________________________________   PHYSICAL EXAM:  VITAL SIGNS: Vitals:   04/12/21 1159  BP: (!) 156/62  Pulse: (!) 50  Resp: 18  Temp: 97.8 F (36.6 C)  SpO2: 100%     Constitutional: Alert and oriented. Well appearing and in no acute distress. Eyes: Conjunctivae are normal. PERRL. EOMI. Head: Atraumatic. Nose:  No congestion/rhinnorhea. Mouth/Throat: Mucous membranes are moist.  Oropharynx non-erythematous. Neck: No stridor. No cervical spine tenderness to palpation. Cardiovascular: Normal rate, regular rhythm. Grossly normal heart sounds.  Good peripheral circulation. Respiratory: Normal respiratory effort.  No retractions. Lungs CTAB. Gastrointestinal: Soft , nondistended, nontender to palpation. No CVA tenderness. Musculoskeletal: No lower extremity tenderness nor edema.  No joint effusions. No signs of acute trauma. Neurologic:  Normal speech and language. No gross focal neurologic deficits are appreciated. No gait instability noted. Skin:  Skin is warm, dry and intact. No rash noted. Psychiatric: Mood and affect are normal. Speech and behavior are normal.  ____________________________________________   LABS (all labs ordered are listed, but only abnormal results are displayed)  Labs Reviewed  BASIC METABOLIC PANEL - Abnormal; Notable for the following components:      Result Value   Sodium 134 (*)    Glucose, Bld 145 (*)     All other components within normal limits  CBC - Abnormal; Notable for the following components:   RBC 3.99 (*)    HCT 37.5 (*)    All other components within normal limits  TROPONIN I (HIGH SENSITIVITY)  TROPONIN I (HIGH SENSITIVITY)   ____________________________________________  12 Lead EKG  Sinus bradycardia, rate of 46 bpm.  Leftward axis.  Right bundle.  No STEMI. Similar to EKG from 1 week ago. ____________________________________________  RADIOLOGY  ED MD interpretation:  CXR reviewed by me without evidence of acute cardiopulmonary pathology.  Official radiology report(s): DG Chest 2 View  Result Date: 04/12/2021 CLINICAL DATA:  Shortness of breath. EXAM: CHEST - 2 VIEW COMPARISON:  Prior chest radiographs 04/05/2021 and earlier. FINDINGS: Heart size within normal limits. Aortic atherosclerosis. No appreciable airspace consolidation or pulmonary edema. No evidence of pleural effusion or pneumothorax. No acute bony abnormality identified. Right shoulder arthroplasty. Left acromioclavicular and glenohumeral joint arthrosis. IMPRESSION: No evidence of acute cardiopulmonary abnormality. Aortic Atherosclerosis (ICD10-I70.0). Electronically Signed   By: Jackey Loge D.O.   On: 04/12/2021 14:31    ____________________________________________   PROCEDURES and INTERVENTIONS  Procedure(s) performed (including Critical Care):  .1-3 Lead EKG Interpretation Performed by: Delton Prairie, MD Authorized by: Delton Prairie, MD     Interpretation: normal     ECG rate:  52   ECG rate assessment: normal     Rhythm: sinus rhythm     Ectopy: none     Conduction: normal    Medications - No data to display  ____________________________________________   MDM / ED COURSE   87 year old male presents to the ED with atypical chest pains, without evidence of acute cardiac pathology, and amenable to outpatient management with cardiology referral.  He is asymptomatic here in the ED and looks  well.  No evidence of neurologic or vascular deficits.  No evidence of ACS, PTX or significant serum derangements such as electrolyte derangements.  His history is more consistent with anxiety than ACS.  We will refer out to cardiology as an outpatient, and discussed return precautions for the ED.     ____________________________________________   FINAL CLINICAL IMPRESSION(S) / ED DIAGNOSES  Final diagnoses:  Other chest pain     ED Discharge Orders     None        Mercia Dowe   Note:  This document was prepared using Dragon voice recognition software and may include unintentional dictation errors.    Delton Prairie, MD 04/12/21 Windy Fast

## 2021-04-12 NOTE — ED Triage Notes (Signed)
Pt started having left sided chest pain that radiates to left arm. Pt was also having sob but is feeling better now.

## 2021-04-28 ENCOUNTER — Other Ambulatory Visit: Payer: Self-pay

## 2021-04-28 ENCOUNTER — Emergency Department: Payer: Medicare Other

## 2021-04-28 ENCOUNTER — Emergency Department
Admission: EM | Admit: 2021-04-28 | Discharge: 2021-04-28 | Disposition: A | Discharge status: Home / Self Care | Payer: Medicare Other | Attending: Emergency Medicine | Admitting: Emergency Medicine

## 2021-04-28 DIAGNOSIS — Z79899 Other long term (current) drug therapy: Secondary | ICD-10-CM | POA: Diagnosis not present

## 2021-04-28 DIAGNOSIS — R079 Chest pain, unspecified: Secondary | ICD-10-CM | POA: Insufficient documentation

## 2021-04-28 DIAGNOSIS — Z96651 Presence of right artificial knee joint: Secondary | ICD-10-CM | POA: Diagnosis not present

## 2021-04-28 DIAGNOSIS — G4700 Insomnia, unspecified: Secondary | ICD-10-CM | POA: Insufficient documentation

## 2021-04-28 DIAGNOSIS — Z7984 Long term (current) use of oral hypoglycemic drugs: Secondary | ICD-10-CM | POA: Insufficient documentation

## 2021-04-28 DIAGNOSIS — Z7982 Long term (current) use of aspirin: Secondary | ICD-10-CM | POA: Insufficient documentation

## 2021-04-28 DIAGNOSIS — I10 Essential (primary) hypertension: Secondary | ICD-10-CM | POA: Diagnosis not present

## 2021-04-28 DIAGNOSIS — R531 Weakness: Secondary | ICD-10-CM | POA: Diagnosis present

## 2021-04-28 DIAGNOSIS — Z7902 Long term (current) use of antithrombotics/antiplatelets: Secondary | ICD-10-CM | POA: Diagnosis not present

## 2021-04-28 DIAGNOSIS — E039 Hypothyroidism, unspecified: Secondary | ICD-10-CM | POA: Diagnosis not present

## 2021-04-28 LAB — CBC WITH DIFFERENTIAL/PLATELET
Abs Immature Granulocytes: 0.01 10*3/uL (ref 0.00–0.07)
Basophils Absolute: 0 10*3/uL (ref 0.0–0.1)
Basophils Relative: 0 %
Eosinophils Absolute: 0.2 10*3/uL (ref 0.0–0.5)
Eosinophils Relative: 4 %
HCT: 39.5 % (ref 39.0–52.0)
Hemoglobin: 13.5 g/dL (ref 13.0–17.0)
Immature Granulocytes: 0 %
Lymphocytes Relative: 40 %
Lymphs Abs: 1.8 10*3/uL (ref 0.7–4.0)
MCH: 32.6 pg (ref 26.0–34.0)
MCHC: 34.2 g/dL (ref 30.0–36.0)
MCV: 95.4 fL (ref 80.0–100.0)
Monocytes Absolute: 0.4 10*3/uL (ref 0.1–1.0)
Monocytes Relative: 10 %
Neutro Abs: 2.2 10*3/uL (ref 1.7–7.7)
Neutrophils Relative %: 46 %
Platelets: 196 10*3/uL (ref 150–400)
RBC: 4.14 MIL/uL — ABNORMAL LOW (ref 4.22–5.81)
RDW: 12.9 % (ref 11.5–15.5)
WBC: 4.6 10*3/uL (ref 4.0–10.5)
nRBC: 0 % (ref 0.0–0.2)

## 2021-04-28 LAB — COMPREHENSIVE METABOLIC PANEL
ALT: 12 U/L (ref 0–44)
AST: 32 U/L (ref 15–41)
Albumin: 3.9 g/dL (ref 3.5–5.0)
Alkaline Phosphatase: 98 U/L (ref 38–126)
Anion gap: 9 (ref 5–15)
BUN: 18 mg/dL (ref 8–23)
CO2: 24 mmol/L (ref 22–32)
Calcium: 8.7 mg/dL — ABNORMAL LOW (ref 8.9–10.3)
Chloride: 99 mmol/L (ref 98–111)
Creatinine, Ser: 1.08 mg/dL (ref 0.61–1.24)
GFR, Estimated: >60 mL/min (ref >60)
Glucose, Bld: 117 mg/dL — ABNORMAL HIGH (ref 70–99)
Potassium: 3.6 mmol/L (ref 3.5–5.1)
Sodium: 132 mmol/L — ABNORMAL LOW (ref 135–145)
Total Bilirubin: 0.8 mg/dL (ref 0.3–1.2)
Total Protein: 7.4 g/dL (ref 6.5–8.1)

## 2021-04-28 LAB — TROPONIN I (HIGH SENSITIVITY): Troponin I (High Sensitivity): 11 ng/L (ref <18)

## 2021-04-28 NOTE — ED Triage Notes (Signed)
BIB [facility] from home. called for sleeping issues. weakness and feeling funny per ems. Pt had a family member died recently and he has been stressed.  NS on EKG 162/62 60 HR  BGL 136

## 2021-04-28 NOTE — ED Provider Notes (Signed)
[facility] Emergency Department Provider Note   ____________________________________________   Event Date/Time   First MD Initiated Contact with Patient 04/28/21 [phone]     (approximate)  I have reviewed the triage vital signs and the nursing notes.   HISTORY  Chief Complaint Weakness    HPI Kevin Burns is a 87 y.o. male who presents via EMS for chest discomfort  LOCATION: Chest DURATION: Began last night TIMING: Stable since onset SEVERITY: Mild QUALITY: Chest discomfort CONTEXT: Patient's son called EMS after patient was having trouble sleeping last night and stated that "my heart does not feel right". MODIFYING FACTORS: Denies any exacerbating or relieving factors.  Patient does note that there was a death in the family recently and this caused increased stress ASSOCIATED SYMPTOMS: Insomnia   Per medical record review, patient has history of hypertension          Past Medical History:  Diagnosis Date   GERD (gastroesophageal reflux disease)    Hyperlipidemia    Hypertension    Hypothyroidism    Seasonal allergies    Seizures (HCC)     Patient Active Problem List   Diagnosis Date Noted   Rotator cuff tear arthropathy of right shoulder 12/18/2018    Past Surgical History:  Procedure Laterality Date   JOINT REPLACEMENT     right knee   REPLACEMENT TOTAL KNEE     right   TOTAL SHOULDER ARTHROPLASTY Right 12/18/2018   Procedure: TOTAL SHOULDER ARTHROPLASTY;  Surgeon: Lyndle Herrlich, MD;  Location: [facility] ORS;  Service: Orthopedics;  Laterality: Right;    Prior to Admission medications   Medication Sig Start Date End Date Taking? Authorizing Provider  albuterol (PROVENTIL) (2.5 MG/3ML) 0.083% nebulizer solution Take 2.5 mg by nebulization every 4 (four) hours as needed for wheezing or shortness of breath. Patient not taking: No sig reported    [provider]  albuterol (VENTOLIN HFA) 108 (90 Base) MCG/ACT inhaler Inhale  2 puffs into the lungs every 4 (four) hours as needed for wheezing or shortness of breath. Every 4-6 hours as needed for wheezing or cough    [provider]  amLODipine (NORVASC) 10 MG tablet Take 1 tablet by mouth daily. 11/17/14   [provider]  aspirin 81 MG chewable tablet Chew 81 mg by mouth daily.    [provider]  atorvastatin (LIPITOR) 80 MG tablet Take 1 tablet by mouth daily. 11/18/14   [provider]  bisacodyl (DULCOLAX) 5 MG EC tablet Take 5 mg by mouth daily as needed for moderate constipation. 1-3 as needed for constipation    [provider]  clopidogrel (PLAVIX) 75 MG tablet Take 75 mg by mouth daily.    [provider]  docusate sodium (COLACE) 100 MG capsule Take 100 mg by mouth 2 (two) times daily.    [provider]  fluticasone (FLONASE) 50 MCG/ACT nasal spray Place 2 sprays into both nostrils daily.    [provider]  furosemide (LASIX) 20 MG tablet Take 20 mg by mouth daily.    [provider]  hydrOXYzine (VISTARIL) 50 MG capsule Take 1 capsule (50 mg total) by mouth 3 (three) times daily as needed. 01/21/20   Cuthriell, Delorise Royals, PA-C  levETIRAcetam (KEPPRA) 500 MG tablet Take 5 tablets by mouth daily. 2 tabs in the AM and three in the PM 11/17/14   [provider]  levothyroxine (SYNTHROID) 112 MCG tablet Take 112 mcg by mouth daily before breakfast. 04/14/16  [provider]  Melatonin 1 MG TABS Take 5 mg by mouth at bedtime as needed.    [provider]  metFORMIN (GLUCOPHAGE) 500 MG tablet Take 500 mg by mouth 2 (two) times daily with a meal.    [provider]  metoprolol succinate (TOPROL-XL) 100 MG 24 hr tablet Take 50 mg by mouth daily.  11/17/14   [provider]  naphazoline-glycerin (CLEAR EYES REDNESS) 0.012-0.2 % SOLN 1-2 drops 4 (four) times daily as needed for eye irritation.    [provider]  naproxen (NAPROSYN) 500 MG  tablet Take 500 mg by mouth 2 (two) times daily as needed.    [provider]  Olopatadine HCl 0.2 % SOLN Apply to eye once. 1 gtt once daily    [provider]  omeprazole (PRILOSEC) 40 MG capsule Take 20 mg by mouth daily.  11/19/14   [provider]  phenytoin (DILANTIN) 100 MG ER capsule Take 3 capsules by mouth daily. 2 in AM and 1 at [facility] 11/17/14   [provider]  polyethylene glycol (MIRALAX / GLYCOLAX) 17 g packet Take 17 g by mouth daily.    [provider]  Propylene Glycol (SYSTANE BALANCE) 0.6 % SOLN Apply to eye daily as needed. 2-3 drops    [provider]  Skin Protectants, Misc. (EUCERIN) cream Apply topically as needed for dry skin. 01/21/20   Cuthriell, Delorise Royals, PA-C  tamsulosin (FLOMAX) 0.4 MG CAPS capsule Take 1 capsule by mouth daily. 11/17/14   [provider]  traZODone (DESYREL) 50 MG tablet Take 50 mg by mouth at bedtime as needed for sleep. 1/2 tab    [provider]  triamcinolone cream (KENALOG) 0.1 % Apply 1 application topically 4 (four) times daily. 01/21/20   Cuthriell, Delorise Royals, PA-C  triamcinolone cream (KENALOG) 0.5 % Apply 1 application topically daily.    [provider]  triamterene-hydrochlorothiazide (MAXZIDE-25) 37.5-25 MG per tablet Take 1 tablet by mouth daily. 11/19/14   [provider]  VASCEPA 1 G CAPS Take 2 tablets by mouth 2 (two) times a day.  11/18/14   [provider]    Allergies Patient has no known allergies.  No family history on file.  Social History Social History   Tobacco Use   Smoking status: Never   Smokeless tobacco: Never  Vaping Use   Vaping Use: Never used  Substance Use Topics   Alcohol use: No   Drug use: Never    Review of Systems Constitutional: No fever/chills Eyes: No visual changes. ENT: No sore throat. Cardiovascular: Endorses chest pain. Respiratory: Denies shortness of breath. Gastrointestinal: No abdominal  pain.  No nausea, no vomiting.  No diarrhea. Genitourinary: Negative for dysuria. Musculoskeletal: Negative for acute arthralgias Skin: Negative for rash. Neurological: Negative for headaches, weakness/numbness/paresthesias in any extremity Psychiatric: Endorses insomnia.  Negative for suicidal ideation/homicidal ideation   ____________________________________________   PHYSICAL EXAM:  VITAL SIGNS: ED Triage Vitals  Enc Vitals Group     BP      Pulse      Resp      Temp      Temp src      SpO2      Weight      Height      Head Circumference      Peak Flow      Pain Score      Pain Loc      Pain Edu?  Excl. in GC?    Constitutional: Alert and oriented. Well appearing obese Hispanic elderly male in no acute distress. Eyes: Conjunctivae are normal. PERRL. Head: Atraumatic. Nose: No congestion/rhinnorhea. Mouth/Throat: Mucous membranes are moist. Neck: No stridor Cardiovascular: Grossly normal heart sounds.  Good peripheral circulation. Respiratory: Normal respiratory effort.  No retractions. Gastrointestinal: Soft and nontender. No distention. Musculoskeletal: No obvious deformities Neurologic:  Normal speech and language. No gross focal neurologic deficits are appreciated. Skin:  Skin is warm and dry. No rash noted. Psychiatric: Mood and affect are normal. Speech and behavior are normal.  ____________________________________________   LABS (all labs ordered are listed, but only abnormal results are displayed)  Labs Reviewed  COMPREHENSIVE METABOLIC PANEL - Abnormal; Notable for the following components:      Result Value   Sodium 132 (*)    Glucose, Bld 117 (*)    Calcium 8.7 (*)    All other components within normal limits  CBC WITH DIFFERENTIAL/PLATELET - Abnormal; Notable for the following components:   RBC 4.14 (*)    All other components within normal limits  TROPONIN I (HIGH SENSITIVITY)   ____________________________________________  EKG  ED  ECG REPORT I, Merwyn Katos, the attending physician, personally viewed and interpreted this ECG.  Date: 04/28/2021 EKG Time: 0909 Rate: 55 Rhythm: normal sinus rhythm QRS Axis: normal Intervals: normal ST/T Wave abnormalities: normal Narrative Interpretation: no evidence of acute ischemia  ____________________________________________  RADIOLOGY  ED MD interpretation: One-view portable chest x-ray shows no evidence of acute abnormalities including no pneumonia, pneumothorax, or widened mediastinum  Official radiology report(s): DG Chest 1 View  Result Date: 04/28/2021 CLINICAL DATA:  Chest pain. EXAM: CHEST  1 VIEW COMPARISON:  April 12, 2021. FINDINGS: Stable cardiomediastinal silhouette. Hypoinflation of the lungs is noted with mild bibasilar subsegmental atelectasis. Bony thorax is unremarkable. IMPRESSION: Hypoinflation of the lungs with mild bibasilar subsegmental atelectasis. Electronically Signed   By: Lupita Raider M.D.   On: 04/28/2021 09:41    ____________________________________________   PROCEDURES  Procedure(s) performed (including Critical Care):  .1-3 Lead EKG Interpretation Performed by: Merwyn Katos, MD Authorized by: Merwyn Katos, MD     Interpretation: normal     ECG rate:  58   ECG rate assessment: normal     Rhythm: sinus rhythm     Ectopy: none     Conduction: normal     ____________________________________________   INITIAL IMPRESSION / ASSESSMENT AND PLAN / ED COURSE  As part of my medical decision making, I reviewed the following data within the electronic medical record, if available:  Nursing notes reviewed and incorporated, Labs reviewed, EKG interpreted, Old chart reviewed, Radiograph reviewed and Notes from prior ED visits reviewed and incorporated        Workup: ECG, CXR, CBC, BMP, Troponin Findings: ECG: No overt evidence of STEMI. No evidence of Brugadas sign, delta wave, epsilon wave, significantly prolonged QTc, or  malignant arrhythmia HS Troponin: Negative x1 Other Labs unremarkable for emergent problems. CXR: Without PTX, PNA, or widened mediastinum Last Stress Test: Never Last Heart Catheterization: Unknown HEART Score: 3  Given History, Exam, and Workup I have low suspicion for ACS, Pneumothorax, Pneumonia, Pulmonary Embolus, Tamponade, Aortic Dissection or other emergent problem as a cause for this presentation.   Reassesment: Prior to discharge patients pain was controlled and they were well appearing.  Disposition:  Discharge. Strict return precautions discussed with patient with full understanding. Advised patient to follow up promptly with primary care provider  ____________________________________________   FINAL CLINICAL IMPRESSION(S) / ED DIAGNOSES  Final diagnoses:  Chest pain, unspecified type     ED Discharge Orders     None        Note:  This document was prepared using Dragon voice recognition software and may include unintentional dictation errors.    Merwyn Katos, MD 04/28/21 1019

## 2021-04-28 NOTE — ED Notes (Signed)
MD and interpreter at bedside.

## 2021-04-28 NOTE — ED Notes (Signed)
Pt states " I was unable to sleep last night. I got up this morning to take my meds and my BP was too high this morning. It read 209. I was concerned. I did not take any BP meds this morning. "

## 2021-05-28 ENCOUNTER — Emergency Department
Admission: EM | Admit: 2021-05-28 | Discharge: 2021-05-28 | Disposition: A | Discharge status: Home / Self Care | Payer: Medicare Other | Attending: Emergency Medicine | Admitting: Emergency Medicine

## 2021-05-28 ENCOUNTER — Encounter: Payer: Self-pay | Admitting: Emergency Medicine

## 2021-05-28 ENCOUNTER — Emergency Department: Payer: Medicare Other

## 2021-05-28 DIAGNOSIS — Z96651 Presence of right artificial knee joint: Secondary | ICD-10-CM | POA: Insufficient documentation

## 2021-05-28 DIAGNOSIS — R42 Dizziness and giddiness: Secondary | ICD-10-CM | POA: Insufficient documentation

## 2021-05-28 DIAGNOSIS — Z96611 Presence of right artificial shoulder joint: Secondary | ICD-10-CM | POA: Diagnosis not present

## 2021-05-28 DIAGNOSIS — Z20822 Contact with and (suspected) exposure to covid-19: Secondary | ICD-10-CM | POA: Diagnosis not present

## 2021-05-28 DIAGNOSIS — Z7902 Long term (current) use of antithrombotics/antiplatelets: Secondary | ICD-10-CM | POA: Insufficient documentation

## 2021-05-28 DIAGNOSIS — E039 Hypothyroidism, unspecified: Secondary | ICD-10-CM | POA: Diagnosis not present

## 2021-05-28 DIAGNOSIS — Z7982 Long term (current) use of aspirin: Secondary | ICD-10-CM | POA: Insufficient documentation

## 2021-05-28 DIAGNOSIS — I10 Essential (primary) hypertension: Secondary | ICD-10-CM | POA: Diagnosis not present

## 2021-05-28 DIAGNOSIS — Z79899 Other long term (current) drug therapy: Secondary | ICD-10-CM | POA: Diagnosis not present

## 2021-05-28 LAB — URINALYSIS, ROUTINE W REFLEX MICROSCOPIC
Bacteria, UA: NONE SEEN
Bilirubin Urine: NEGATIVE
Glucose, UA: NEGATIVE mg/dL
Hgb urine dipstick: NEGATIVE
Ketones, ur: NEGATIVE mg/dL
Nitrite: NEGATIVE
Protein, ur: NEGATIVE mg/dL
Specific Gravity, Urine: 1.018 (ref 1.005–1.030)
WBC, UA: NONE SEEN WBC/hpf (ref 0–5)
pH: 5 (ref 5.0–8.0)

## 2021-05-28 LAB — RESP PANEL BY RT-PCR (FLU A&B, COVID) ARPGX2
Influenza A by PCR: NEGATIVE
Influenza B by PCR: NEGATIVE
SARS Coronavirus 2 by RT PCR: NEGATIVE

## 2021-05-28 LAB — BASIC METABOLIC PANEL
Anion gap: 11 (ref 5–15)
BUN: 32 mg/dL — ABNORMAL HIGH (ref 8–23)
CO2: 28 mmol/L (ref 22–32)
Calcium: 8.8 mg/dL — ABNORMAL LOW (ref 8.9–10.3)
Chloride: 93 mmol/L — ABNORMAL LOW (ref 98–111)
Creatinine, Ser: 1.4 mg/dL — ABNORMAL HIGH (ref 0.61–1.24)
GFR, Estimated: 49 mL/min — ABNORMAL LOW (ref >60)
Glucose, Bld: 164 mg/dL — ABNORMAL HIGH (ref 70–99)
Potassium: 3.1 mmol/L — ABNORMAL LOW (ref 3.5–5.1)
Sodium: 132 mmol/L — ABNORMAL LOW (ref 135–145)

## 2021-05-28 LAB — CBC
HCT: 36.6 % — ABNORMAL LOW (ref 39.0–52.0)
Hemoglobin: 12.4 g/dL — ABNORMAL LOW (ref 13.0–17.0)
MCH: 31.9 pg (ref 26.0–34.0)
MCHC: 33.9 g/dL (ref 30.0–36.0)
MCV: 94.1 fL (ref 80.0–100.0)
Platelets: 219 10*3/uL (ref 150–400)
RBC: 3.89 MIL/uL — ABNORMAL LOW (ref 4.22–5.81)
RDW: 12.6 % (ref 11.5–15.5)
WBC: 5.7 10*3/uL (ref 4.0–10.5)
nRBC: 0 % (ref 0.0–0.2)

## 2021-05-28 LAB — PHENYTOIN LEVEL, TOTAL: Phenytoin Lvl: 9.1 ug/mL — ABNORMAL LOW (ref 10.0–20.0)

## 2021-05-28 LAB — MAGNESIUM: Magnesium: 2.2 mg/dL (ref 1.7–2.4)

## 2021-05-28 MED ORDER — BENZONATATE 100 MG PO CAPS: 100.0000 mg | ORAL_CAPSULE | Freq: Four times a day (QID) | ORAL | 0 refills | Status: AC | PRN

## 2021-05-28 NOTE — ED Triage Notes (Addendum)
Pt in with son with dizziness that was first noticed this morning around 0800. States it is worse with standing, denies any cp, n/v/d or sob. Does have dry cough x 3 days, no fevers, but does have some congestion. Tested negative for Covid at his doc office a few days ago. Neuro intact on assessment, except R facial droop, which son says is normal from old seizures. CBG's have been normal. Son states the pt had hx of frequent seizures 18 yrs ago, is on multiple seizure meds currently. Had recent cardiac and neuro checkup, all was clear for both recent workups

## 2021-05-28 NOTE — ED Provider Notes (Signed)
[facility] Emergency Department Provider Note  Time seen: 8:59 AM  I have reviewed the triage vital signs and the nursing notes.   HISTORY  Chief Complaint Dizziness   HPI Kaidence Callaway is a 87 y.o. male with a past medical history of gastric reflux, hypertension, hyperlipidemia, seizure disorder, presents to the emergency department for dizziness.  According to the patient he had 2 episodes of dizziness yesterday 1 at 9:00 in the morning and the other around 10 PM at night.  According to the patient he was standing up both times when he felt dizzy/lightheaded or off balance.  States symptoms lasted for a minute or 2 and then resolved.  Has not had any issues since.  Denies any weakness or numbness of any arm or leg confusion or slurred speech.  Does have a slight right facial droop which is chronic per son.  Patient denies any recent illnesses but does state a chronic cough and used a codeine cough medication yesterday as well as the day before.  Patient was also recently started on isosorbide approximately 2 weeks ago.   Past Medical History:  Diagnosis Date   GERD (gastroesophageal reflux disease)    Hyperlipidemia    Hypertension    Hypothyroidism    Seasonal allergies    Seizures [facility])     Patient Active Problem List   Diagnosis Date Noted   Rotator cuff tear arthropathy of right shoulder 12/18/2018    Past Surgical History:  Procedure Laterality Date   JOINT REPLACEMENT     right knee   REPLACEMENT TOTAL KNEE     right   TOTAL SHOULDER ARTHROPLASTY Right 12/18/2018   Procedure: TOTAL SHOULDER ARTHROPLASTY;  Surgeon: Lyndle Herrlich, MD;  Location: [facility] ORS;  Service: Orthopedics;  Laterality: Right;    Prior to Admission medications   Medication Sig Start Date End Date Taking? Authorizing Provider  albuterol (PROVENTIL) (2.5 MG/3ML) 0.083% nebulizer solution Take 2.5 mg by nebulization every 4 (four) hours as needed for wheezing or shortness of  breath. Patient not taking: No sig reported    [provider]  albuterol (VENTOLIN HFA) 108 (90 Base) MCG/ACT inhaler Inhale 2 puffs into the lungs every 4 (four) hours as needed for wheezing or shortness of breath. Every 4-6 hours as needed for wheezing or cough    [provider]  amLODipine (NORVASC) 10 MG tablet Take 1 tablet by mouth daily. 11/17/14   [provider]  aspirin 81 MG chewable tablet Chew 81 mg by mouth daily.    [provider]  atorvastatin (LIPITOR) 80 MG tablet Take 1 tablet by mouth daily. 11/18/14   [provider]  bisacodyl (DULCOLAX) 5 MG EC tablet Take 5 mg by mouth daily as needed for moderate constipation. 1-3 as needed for constipation    [provider]  clopidogrel (PLAVIX) 75 MG tablet Take 75 mg by mouth daily.    [provider]  docusate sodium (COLACE) 100 MG capsule Take 100 mg by mouth 2 (two) times daily.    [provider]  fluticasone (FLONASE) 50 MCG/ACT nasal spray Place 2 sprays into both nostrils daily.    [provider]  furosemide (LASIX) 20 MG tablet Take 20 mg by mouth daily.    [provider]  hydrOXYzine (VISTARIL) 50 MG capsule Take 1 capsule (50 mg total) by mouth 3 (three) times daily as needed. 01/21/20   Cuthriell, Delorise Royals, PA-C  levETIRAcetam (KEPPRA) 500 MG tablet Take  5 tablets by mouth daily. 2 tabs in the AM and three in the PM 11/17/14   [provider]  levothyroxine (SYNTHROID) 112 MCG tablet Take 112 mcg by mouth daily before breakfast. 04/14/16   [provider]  Melatonin 1 MG TABS Take 5 mg by mouth at bedtime as needed.    [provider]  metFORMIN (GLUCOPHAGE) 500 MG tablet Take 500 mg by mouth 2 (two) times daily with a meal.    [provider]  metoprolol succinate (TOPROL-XL) 100 MG 24 hr tablet Take 50 mg by mouth daily.  11/17/14   [provider]  naphazoline-glycerin (CLEAR EYES  REDNESS) 0.012-0.2 % SOLN 1-2 drops 4 (four) times daily as needed for eye irritation.    [provider]  naproxen (NAPROSYN) 500 MG tablet Take 500 mg by mouth 2 (two) times daily as needed.    [provider]  Olopatadine HCl 0.2 % SOLN Apply to eye once. 1 gtt once daily    [provider]  omeprazole (PRILOSEC) 40 MG capsule Take 20 mg by mouth daily.  11/19/14   [provider]  phenytoin (DILANTIN) 100 MG ER capsule Take 3 capsules by mouth daily. 2 in AM and 1 at [facility] 11/17/14   [provider]  polyethylene glycol (MIRALAX / GLYCOLAX) 17 g packet Take 17 g by mouth daily.    [provider]  Propylene Glycol (SYSTANE BALANCE) 0.6 % SOLN Apply to eye daily as needed. 2-3 drops    [provider]  Skin Protectants, Misc. (EUCERIN) cream Apply topically as needed for dry skin. 01/21/20   Cuthriell, Delorise Royals, PA-C  tamsulosin (FLOMAX) 0.4 MG CAPS capsule Take 1 capsule by mouth daily. 11/17/14   [provider]  traZODone (DESYREL) 50 MG tablet Take 50 mg by mouth at bedtime as needed for sleep. 1/2 tab    [provider]  triamcinolone cream (KENALOG) 0.1 % Apply 1 application topically 4 (four) times daily. 01/21/20   Cuthriell, Delorise Royals, PA-C  triamcinolone cream (KENALOG) 0.5 % Apply 1 application topically daily.    [provider]  triamterene-hydrochlorothiazide (MAXZIDE-25) 37.5-25 MG per tablet Take 1 tablet by mouth daily. 11/19/14   [provider]  VASCEPA 1 G CAPS Take 2 tablets by mouth 2 (two) times a day.  11/18/14   [provider]    No Known Allergies  No family history on file.  Social History Social History   Tobacco Use   Smoking status: Never   Smokeless tobacco: Never  Vaping Use   Vaping Use: Never used  Substance Use Topics   Alcohol use: No   Drug use: Never    Review of Systems Constitutional: Negative for fever.  2 episodes of dizziness. ENT:  Negative for recent illness/congestion Cardiovascular: Negative for chest pain. Respiratory: Negative for shortness of breath.  Cough at night, chronic per patient. Gastrointestinal: Negative for abdominal pain, vomiting Genitourinary: Negative for urinary compaints Musculoskeletal: Negative for musculoskeletal complaints Neurological: Negative for headache All other ROS negative  ____________________________________________   PHYSICAL EXAM:  VITAL SIGNS: ED Triage Vitals [05/28/21 0131]  Enc Vitals Group     BP (!) 162/70     Pulse Rate 86     Resp 18     Temp 97.8 F (36.6 C)     Temp Source Oral     SpO2 100 %     Weight      Height  Head Circumference      Peak Flow      Pain Score      Pain Loc      Pain Edu?      Excl. in GC?    Constitutional: Alert and oriented. Well appearing and in no distress. Eyes: Normal exam ENT      Head: Normocephalic and atraumatic.      Mouth/Throat: Mucous membranes are moist. Cardiovascular: Normal rate, regular rhythm. Respiratory: Normal respiratory effort without tachypnea nor retractions. Breath sounds are clear  Gastrointestinal: Soft and nontender. No distention. Musculoskeletal: Nontender with normal range of motion in all extremities. Neurologic:  Normal speech and language. No gross focal neurologic deficits Skin:  Skin is warm, dry and intact.  Psychiatric: Mood and affect are normal.  ____________________________________________    EKG  EKG viewed and interpreted by myself shows a normal sinus rhythm at 76 bpm with a widened QRS, normal axis, normal intervals, nonspecific ST changes.  ____________________________________________    RADIOLOGY  Chest x-ray negative  ____________________________________________   INITIAL IMPRESSION / ASSESSMENT AND PLAN / ED COURSE  Pertinent labs & imaging results that were available during my care of the patient were reviewed by me and considered in my medical decision  making (see chart for details).   Patient presents to the emergency department for 2 episodes of dizziness/lightheadedness that occurred yesterday.  Both episodes occurred as the patient first stood up when he felt off balance for 1 or 2 minutes and then symptoms resolved.  Denies any weakness or numbness at any point.  Patient's lab work is overall reassuring slight increase in creatinine versus baseline I discussed with the patient increasing oral fluids.  Son states the patient has been using a codeine-based cough medication for the past several days which could contribute to his symptoms discussed discontinuing the use of the cough medication with a trial of Tessalon instead.  Remainder the patient's lab work is largely nonrevealing.  COVID/flu negative.  Patient was recently started on isosorbide, patient states he stopped taking it for 2 to 3 days and did restart yesterday, this could also contribute to the patient's symptoms.  Overall patient appears well reassuring physical exam, reassuring neurological exam.  No headache at any point.  Given the patient's reassuring exam and overall reassuring lab work I believe the patient is safe for discharge home.  I discussed with the patient discontinuing the codeine cough medication, increasing oral hydration and following up with his doctor to discuss isosorbide use as the patient denies any recent chest pain.  I also discussed with the patient checking his blood pressure at home to ensure that it does not drop when he is feeling dizzy.  Discussed return precautions, patient and son agreeable to plan of care.  Lyriq Jarchow was evaluated in Emergency Department on 05/28/2021 for the symptoms described in the history of present illness. He was evaluated in the context of the global COVID-19 pandemic, which necessitated consideration that the patient might be at risk for infection with the SARS-CoV-2 virus that causes COVID-19. Institutional protocols and algorithms  that pertain to the evaluation of patients at risk for COVID-19 are in a state of rapid change based on information released by regulatory bodies including the CDC and federal and state organizations. These policies and algorithms were followed during the patient's care in the ED.  ____________________________________________   FINAL CLINICAL IMPRESSION(S) / ED DIAGNOSES  Dizziness   Minna Antis, MD 05/28/21 0945

## 2021-05-28 NOTE — Discharge Instructions (Signed)
As we discussed please increase the amount of fluids you are drinking over the next 2 to 3 days.  Please avoid codeine products.  If you become dizzy please check your blood pressure.  Please follow-up with your doctor for recheck/reevaluation.  Return to the emergency department if you have any further significant symptoms or any other issue personally concerning to yourself.

## 2021-06-04 ENCOUNTER — Emergency Department: Payer: Medicare Other

## 2021-06-04 ENCOUNTER — Emergency Department
Admission: EM | Admit: 2021-06-04 | Discharge: 2021-06-04 | Disposition: A | Discharge status: Home / Self Care | Payer: Medicare Other | Attending: Emergency Medicine | Admitting: Emergency Medicine

## 2021-06-04 ENCOUNTER — Encounter: Payer: Self-pay | Admitting: Emergency Medicine

## 2021-06-04 ENCOUNTER — Other Ambulatory Visit: Payer: Self-pay

## 2021-06-04 DIAGNOSIS — E039 Hypothyroidism, unspecified: Secondary | ICD-10-CM | POA: Insufficient documentation

## 2021-06-04 DIAGNOSIS — Z96611 Presence of right artificial shoulder joint: Secondary | ICD-10-CM | POA: Diagnosis not present

## 2021-06-04 DIAGNOSIS — I10 Essential (primary) hypertension: Secondary | ICD-10-CM | POA: Insufficient documentation

## 2021-06-04 DIAGNOSIS — R42 Dizziness and giddiness: Secondary | ICD-10-CM | POA: Insufficient documentation

## 2021-06-04 DIAGNOSIS — Z7982 Long term (current) use of aspirin: Secondary | ICD-10-CM | POA: Diagnosis not present

## 2021-06-04 DIAGNOSIS — Z7984 Long term (current) use of oral hypoglycemic drugs: Secondary | ICD-10-CM | POA: Insufficient documentation

## 2021-06-04 DIAGNOSIS — Z79899 Other long term (current) drug therapy: Secondary | ICD-10-CM | POA: Insufficient documentation

## 2021-06-04 DIAGNOSIS — Z96651 Presence of right artificial knee joint: Secondary | ICD-10-CM | POA: Insufficient documentation

## 2021-06-04 LAB — BASIC METABOLIC PANEL
Anion gap: 9 (ref 5–15)
BUN: 26 mg/dL — ABNORMAL HIGH (ref 8–23)
CO2: 28 mmol/L (ref 22–32)
Calcium: 8.8 mg/dL — ABNORMAL LOW (ref 8.9–10.3)
Chloride: 96 mmol/L — ABNORMAL LOW (ref 98–111)
Creatinine, Ser: 1.27 mg/dL — ABNORMAL HIGH (ref 0.61–1.24)
GFR, Estimated: 55 mL/min — ABNORMAL LOW (ref >60)
Glucose, Bld: 143 mg/dL — ABNORMAL HIGH (ref 70–99)
Potassium: 3.6 mmol/L (ref 3.5–5.1)
Sodium: 133 mmol/L — ABNORMAL LOW (ref 135–145)

## 2021-06-04 LAB — CBC
HCT: 35.8 % — ABNORMAL LOW (ref 39.0–52.0)
Hemoglobin: 12.3 g/dL — ABNORMAL LOW (ref 13.0–17.0)
MCH: 32.4 pg (ref 26.0–34.0)
MCHC: 34.4 g/dL (ref 30.0–36.0)
MCV: 94.2 fL (ref 80.0–100.0)
Platelets: 256 10*3/uL (ref 150–400)
RBC: 3.8 MIL/uL — ABNORMAL LOW (ref 4.22–5.81)
RDW: 12.6 % (ref 11.5–15.5)
WBC: 5.9 10*3/uL (ref 4.0–10.5)
nRBC: 0 % (ref 0.0–0.2)

## 2021-06-04 LAB — TROPONIN I (HIGH SENSITIVITY): Troponin I (High Sensitivity): 12 ng/L (ref <18)

## 2021-06-04 NOTE — ED Provider Notes (Signed)
[facility] Emergency Department Provider Note ____________________________________________   Event Date/Time   First MD Initiated Contact with Patient 06/04/21 [phone]     (approximate)  I have reviewed the triage vital signs and the nursing notes.  HISTORY  Chief Complaint Dizziness  Spanish interpreter utilized, Raphael  HPI Kevin Burns is a 87 y.o. male history of hypertension hyperlipidemia  He has had the feeling of dizziness this morning.  Is also had the same feeling of dizziness off and on for the last 2 weeks.  He had a cough and dry cough its been present now for about a month.  Started experiencing dizziness off and on.  No other pain or discomfort  He had a fall about a week ago.  He noticed some soreness along his lower neck.  Denies any headache.  No numbness or weakness.  No chest pain no trouble breathing.  The dizziness is hard to describe it as a feeling of a "lightheaded" especially with standing up.   Past Medical History:  Diagnosis Date   GERD (gastroesophageal reflux disease)    Hyperlipidemia    Hypertension    Hypothyroidism    Seasonal allergies    Seizures [facility])     Patient Active Problem List   Diagnosis Date Noted   Rotator cuff tear arthropathy of right shoulder 12/18/2018    Past Surgical History:  Procedure Laterality Date   JOINT REPLACEMENT     right knee   REPLACEMENT TOTAL KNEE     right   TOTAL SHOULDER ARTHROPLASTY Right 12/18/2018   Procedure: TOTAL SHOULDER ARTHROPLASTY;  Surgeon: Lyndle Herrlich, MD;  Location: [facility] ORS;  Service: Orthopedics;  Laterality: Right;    Prior to Admission medications   Medication Sig Start Date End Date Taking? Authorizing Provider  albuterol (PROVENTIL) (2.5 MG/3ML) 0.083% nebulizer solution Take 2.5 mg by nebulization every 4 (four) hours as needed for wheezing or shortness of breath. Patient not taking: No sig reported    [provider]  albuterol (VENTOLIN  HFA) 108 (90 Base) MCG/ACT inhaler Inhale 2 puffs into the lungs every 4 (four) hours as needed for wheezing or shortness of breath. Every 4-6 hours as needed for wheezing or cough    [provider]  amLODipine (NORVASC) 10 MG tablet Take 1 tablet by mouth daily. 11/17/14   [provider]  aspirin 81 MG chewable tablet Chew 81 mg by mouth daily.    [provider]  atorvastatin (LIPITOR) 80 MG tablet Take 1 tablet by mouth daily. 11/18/14   [provider]  benzonatate (TESSALON PERLES) 100 MG capsule Take 1 capsule (100 mg total) by mouth every 6 (six) hours as needed for cough. 05/28/21 05/28/22  Minna Antis, MD  bisacodyl (DULCOLAX) 5 MG EC tablet Take 5 mg by mouth daily as needed for moderate constipation. 1-3 as needed for constipation    [provider]  clopidogrel (PLAVIX) 75 MG tablet Take 75 mg by mouth daily.    [provider]  docusate sodium (COLACE) 100 MG capsule Take 100 mg by mouth 2 (two) times daily.    [provider]  fluticasone (FLONASE) 50 MCG/ACT nasal spray Place 2 sprays into both nostrils daily.    [provider]  furosemide (LASIX) 20 MG tablet Take 20 mg by mouth daily.    [provider]  hydrOXYzine (VISTARIL) 50 MG capsule Take 1 capsule (50 mg total) by mouth 3 (three) times daily as needed. 01/21/20  Cuthriell, Delorise Royals, PA-C  levETIRAcetam (KEPPRA) 500 MG tablet Take 5 tablets by mouth daily. 2 tabs in the AM and three in the PM 11/17/14   [provider]  levothyroxine (SYNTHROID) 112 MCG tablet Take 112 mcg by mouth daily before breakfast. 04/14/16   [provider]  Melatonin 1 MG TABS Take 5 mg by mouth at bedtime as needed.    [provider]  metFORMIN (GLUCOPHAGE) 500 MG tablet Take 500 mg by mouth 2 (two) times daily with a meal.    [provider]  metoprolol succinate (TOPROL-XL) 100 MG 24 hr tablet Take 50 mg by mouth daily.   11/17/14   [provider]  naphazoline-glycerin (CLEAR EYES REDNESS) 0.012-0.2 % SOLN 1-2 drops 4 (four) times daily as needed for eye irritation.    [provider]  naproxen (NAPROSYN) 500 MG tablet Take 500 mg by mouth 2 (two) times daily as needed.    [provider]  Olopatadine HCl 0.2 % SOLN Apply to eye once. 1 gtt once daily    [provider]  omeprazole (PRILOSEC) 40 MG capsule Take 20 mg by mouth daily.  11/19/14   [provider]  phenytoin (DILANTIN) 100 MG ER capsule Take 3 capsules by mouth daily. 2 in AM and 1 at [facility] 11/17/14   [provider]  polyethylene glycol (MIRALAX / GLYCOLAX) 17 g packet Take 17 g by mouth daily.    [provider]  Propylene Glycol (SYSTANE BALANCE) 0.6 % SOLN Apply to eye daily as needed. 2-3 drops    [provider]  Skin Protectants, Misc. (EUCERIN) cream Apply topically as needed for dry skin. 01/21/20   Cuthriell, Delorise Royals, PA-C  tamsulosin (FLOMAX) 0.4 MG CAPS capsule Take 1 capsule by mouth daily. 11/17/14   [provider]  traZODone (DESYREL) 50 MG tablet Take 50 mg by mouth at bedtime as needed for sleep. 1/2 tab    [provider]  triamcinolone cream (KENALOG) 0.1 % Apply 1 application topically 4 (four) times daily. 01/21/20   Cuthriell, Delorise Royals, PA-C  triamcinolone cream (KENALOG) 0.5 % Apply 1 application topically daily.    [provider]  triamterene-hydrochlorothiazide (MAXZIDE-25) 37.5-25 MG per tablet Take 1 tablet by mouth daily. 11/19/14   [provider]  VASCEPA 1 G CAPS Take 2 tablets by mouth 2 (two) times a day.  11/18/14   [provider]    Allergies Patient has no known allergies.  History reviewed. No pertinent family history.  Social History Social History   Tobacco Use   Smoking status: Never   Smokeless tobacco: Never  Vaping Use   Vaping Use: Never used  Substance Use Topics   Alcohol use: No    Drug use: Never    Review of Systems Constitutional: No fever/chills or recent illness but has had a dry cough and of course had a fall about a week ago with lingering feeling of dizziness now for some time at least over a week Eyes: No visual changes. ENT: No sore throat.  Some soreness along his back of his neck Cardiovascular: Denies chest pain. Respiratory: Denies shortness of breath. Gastrointestinal: No abdominal pain.   Genitourinary: Negative for dysuria. Musculoskeletal: Negative for back pain except some achiness of his lower neck. Skin: Negative for rash. Neurological: Negative for headaches, areas of focal weakness or numbness.  ____________________________________________   PHYSICAL EXAM:  VITAL SIGNS: ED Triage Vitals  Enc Vitals Group  BP 06/04/21 0623 132/61     Pulse Rate 06/04/21 0623 72     Resp 06/04/21 0623 18     Temp 06/04/21 0623 98.9 F (37.2 C)     Temp Source 06/04/21 0623 Oral     SpO2 06/04/21 0623 95 %     Weight 06/04/21 0629 198 lb 6.6 oz (90 kg)     Height 06/04/21 0629 5\' 6"  (1.676 m)     Head Circumference --      Peak Flow --      Pain Score 06/04/21 0628 7     Pain Loc --      Pain Edu? --      Excl. in [place]? --     Constitutional: Alert and oriented. Well appearing and in no acute distress.  He is very pleasant. Eyes: Conjunctivae are normal. Head: Atraumatic. Nose: No congestion/rhinnorhea. Mouth/Throat: Mucous membranes are moist. Neck: No stridor.  Cardiovascular: Normal rate, regular rhythm. Grossly normal heart sounds.  Good peripheral circulation. Respiratory: Normal respiratory effort.  No retractions. Lungs CTAB. Gastrointestinal: Soft and nontender. No distention. Musculoskeletal: No lower extremity tenderness nor edema. Neurologic:  Normal speech and language. No gross focal neurologic deficits are appreciated.  No ataxia.  Extraocular movements are normal.  Normal facial expressions.  No weakness in any extremity.   Able to sit himself up on his own without difficulty.  Stands without difficulty Skin:  Skin is warm, dry and intact. No rash noted. Psychiatric: Mood and affect are normal. Speech and behavior are normal.  ____________________________________________   LABS (all labs ordered are listed, but only abnormal results are displayed)  Labs Reviewed  BASIC METABOLIC PANEL - Abnormal; Notable for the following components:      Result Value   Sodium 133 (*)    Chloride 96 (*)    Glucose, Bld 143 (*)    BUN 26 (*)    Creatinine, Ser 1.27 (*)    Calcium 8.8 (*)    GFR, Estimated 55 (*)    All other components within normal limits  CBC - Abnormal; Notable for the following components:   RBC 3.80 (*)    Hemoglobin 12.3 (*)    HCT 35.8 (*)    All other components within normal limits  TROPONIN I (HIGH SENSITIVITY)   ____________________________________________  EKG  Reviewed inter by me at 640 heart rate 70 QRS 160 QTc 490 Normal sinus, wide QRS complex with right bundle branch block.  No evidence of acute ischemic morphology.  Compared with previous from May 28, 2021 no significant change noted ____________________________________________  RADIOLOGY  DG Chest 2 View  Result Date: 06/04/2021 CLINICAL DATA:  Fall. EXAM: CHEST - 2 VIEW COMPARISON:  May 28, 2021. FINDINGS: The heart size and mediastinal contours are within normal limits. Both lungs are clear. The visualized skeletal structures are unremarkable. IMPRESSION: No active cardiopulmonary disease. Electronically Signed   By: May 30, 2021 M.D.   On: 06/04/2021 07:43   CT HEAD WO CONTRAST (14/03/2021)  Result Date: 06/04/2021 CLINICAL DATA:  Dizziness, fall. EXAM: CT HEAD WITHOUT CONTRAST CT CERVICAL SPINE WITHOUT CONTRAST TECHNIQUE: Multidetector CT imaging of the head and cervical spine was performed following the standard protocol without intravenous contrast. Multiplanar CT image reconstructions of the cervical spine were  also generated. COMPARISON:  April 05, 2021.  July 21, 2010. FINDINGS: CT HEAD FINDINGS Brain: No evidence of acute infarction, hemorrhage, hydrocephalus, extra-axial collection or mass lesion/mass effect. Vascular: No hyperdense vessel or unexpected  calcification. Skull: Normal. Negative for fracture or focal lesion. Sinuses/Orbits: No acute finding. Other: None. CT CERVICAL SPINE FINDINGS Alignment: Normal. Skull base and vertebrae: No acute fracture. No primary bone lesion or focal pathologic process. Soft tissues and spinal canal: No prevertebral fluid or swelling. No visible canal hematoma. 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 Moderate degenerative disc disease is noted at C5-6 and C6-7 with anterior osteophyte formation. Upper chest: Negative. Other: Degenerative changes are seen involving the right-sided posterior facet joints. IMPRESSION: No acute intracranial abnormality seen. Multilevel degenerative changes are noted in the cervical spine. No acute abnormality is noted. Electronically Signed   By: Lupita Raider M.D.   On: 06/04/2021 07:13   CT Cervical Spine Wo Contrast  Result Date: 06/04/2021 CLINICAL DATA:  Dizziness, fall. EXAM: CT HEAD WITHOUT CONTRAST CT CERVICAL SPINE WITHOUT CONTRAST TECHNIQUE: Multidetector CT imaging of the head and cervical spine was performed following the standard protocol without intravenous contrast. Multiplanar CT image reconstructions of the cervical spine were also generated. COMPARISON:  April 05, 2021.  July 21, 2010. FINDINGS: CT HEAD FINDINGS Brain: No evidence of acute infarction, hemorrhage, hydrocephalus, extra-axial collection or mass lesion/mass effect. Vascular: No hyperdense vessel or unexpected calcification. Skull: Normal. Negative for fracture or focal lesion. Sinuses/Orbits: No acute finding. Other: None. CT CERVICAL SPINE FINDINGS Alignment: Normal. Skull base and vertebrae: No acute  fracture. No primary bone lesion or focal pathologic process. Soft tissues and spinal canal: No prevertebral fluid or swelling. No visible canal hematoma. 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 Moderate degenerative disc disease is noted at C5-6 and C6-7 with anterior osteophyte formation. Upper chest: Negative. Other: Degenerative changes are seen involving the right-sided posterior facet joints. IMPRESSION: No acute intracranial abnormality seen. Multilevel degenerative changes are noted in the cervical spine. No acute abnormality is noted. Electronically Signed   By: Lupita Raider M.D.   On: 06/04/2021 07:13      Imaging of the cervical spine, head negative for acute findings.  Degenerative changes noted in cervical spine.  Chest x-ray without acute finding -discussed these with the patient ____________________________________________   PROCEDURES  Procedure(s) performed: None  Procedures  Critical Care performed: No  ____________________________________________   INITIAL IMPRESSION / ASSESSMENT AND PLAN / ED COURSE  Pertinent labs & imaging results that were available during my care of the patient were reviewed by me and considered in my medical decision making (see chart for details).   Patient presents for dizziness.  Somewhat hard to elucidate a clear cause on clinical exam but he certainly has reassuring testing here including reassuring imaging of the brain, I would expect that if he had a gross abnormality or stroke this would likely be detectable now especially given his dizziness is persisted for over a week.  He describes lightheadedness most notable when he sits up.  Is been present for 2 weeks and he started a new medicine but only took 1 pill, I think this was Imdur.  He has since discontinued it.  His orthostatics here are reassuring.  No chest pain no trouble breathing.  No acute cardiac neurologic pulmonary or vascular  symptoms except he does report a dry cough now for several weeks.  Not noted to be on an ACE inhibitor.  No obvious infectious symptoms  ----------------------------------------- 8:40 AM on 06/04/2021 -----------------------------------------  Patient's work-up very reassuring normal vital signs fully  alert.  Return precautions and treatment recommendations and follow-up provided to patient who is agreeable with the plan.           ____________________________________________   FINAL CLINICAL IMPRESSION(S) / ED DIAGNOSES  Final diagnoses:  Lightheadedness  Orthostatic dizziness        Note:  This document was prepared using Dragon voice recognition software and may include unintentional dictation errors       Sharyn Creamer, MD 06/04/21 [phone]

## 2021-06-04 NOTE — ED Triage Notes (Signed)
Pt to ED via EMS from home c/o dizziness tonight.  States dizziness worse with changing positions and also when he lies flat.  States was scared to go to sleep because of the dizziness.  States fell 1 week ago d/t dizziness.  States neck pain for a couple days.  Pt A&Ox4, chest rise even and unlabored, skin WNL and in NAD at this time.

## 2021-06-04 NOTE — ED Notes (Signed)
Pt discharged home with son to pick up. Verbalized understanding of follow-up care and paperwork.

## 2021-08-12 ENCOUNTER — Emergency Department: Payer: Medicare Other

## 2021-08-12 ENCOUNTER — Emergency Department
Admission: EM | Admit: 2021-08-12 | Discharge: 2021-08-12 | Disposition: A | Discharge status: Home / Self Care | Payer: Medicare Other | Attending: Emergency Medicine | Admitting: Emergency Medicine

## 2021-08-12 ENCOUNTER — Other Ambulatory Visit: Payer: Self-pay

## 2021-08-12 ENCOUNTER — Encounter: Payer: Self-pay | Admitting: Emergency Medicine

## 2021-08-12 DIAGNOSIS — I700 Atherosclerosis of aorta: Secondary | ICD-10-CM | POA: Insufficient documentation

## 2021-08-12 DIAGNOSIS — Z20822 Contact with and (suspected) exposure to covid-19: Secondary | ICD-10-CM | POA: Diagnosis not present

## 2021-08-12 DIAGNOSIS — I10 Essential (primary) hypertension: Secondary | ICD-10-CM | POA: Diagnosis not present

## 2021-08-12 DIAGNOSIS — R600 Localized edema: Secondary | ICD-10-CM | POA: Diagnosis present

## 2021-08-12 DIAGNOSIS — R0602 Shortness of breath: Secondary | ICD-10-CM | POA: Diagnosis not present

## 2021-08-12 DIAGNOSIS — N50812 Left testicular pain: Secondary | ICD-10-CM | POA: Insufficient documentation

## 2021-08-12 DIAGNOSIS — I861 Scrotal varices: Secondary | ICD-10-CM | POA: Diagnosis not present

## 2021-08-12 LAB — COMPREHENSIVE METABOLIC PANEL
ALT: 11 U/L (ref 0–44)
AST: 25 U/L (ref 15–41)
Albumin: 3.8 g/dL (ref 3.5–5.0)
Alkaline Phosphatase: 78 U/L (ref 38–126)
Anion gap: 6 (ref 5–15)
BUN: 21 mg/dL (ref 8–23)
CO2: 27 mmol/L (ref 22–32)
Calcium: 8.9 mg/dL (ref 8.9–10.3)
Chloride: 102 mmol/L (ref 98–111)
Creatinine, Ser: 1.12 mg/dL (ref 0.61–1.24)
GFR, Estimated: >60 mL/min (ref >60)
Glucose, Bld: 129 mg/dL — ABNORMAL HIGH (ref 70–99)
Potassium: 3.6 mmol/L (ref 3.5–5.1)
Sodium: 135 mmol/L (ref 135–145)
Total Bilirubin: 0.4 mg/dL (ref 0.3–1.2)
Total Protein: 6.9 g/dL (ref 6.5–8.1)

## 2021-08-12 LAB — URINALYSIS, ROUTINE W REFLEX MICROSCOPIC
Bilirubin Urine: NEGATIVE
Glucose, UA: NEGATIVE mg/dL
Hgb urine dipstick: NEGATIVE
Ketones, ur: NEGATIVE mg/dL
Leukocytes,Ua: NEGATIVE
Nitrite: NEGATIVE
Protein, ur: NEGATIVE mg/dL
Specific Gravity, Urine: 1.013 (ref 1.005–1.030)
pH: 7 (ref 5.0–8.0)

## 2021-08-12 LAB — TROPONIN I (HIGH SENSITIVITY)
Troponin I (High Sensitivity): 11 ng/L (ref <18)
Troponin I (High Sensitivity): 10 ng/L (ref <18)

## 2021-08-12 LAB — RESP PANEL BY RT-PCR (FLU A&B, COVID) ARPGX2
Influenza A by PCR: NEGATIVE
Influenza B by PCR: NEGATIVE
SARS Coronavirus 2 by RT PCR: NEGATIVE

## 2021-08-12 LAB — PROCALCITONIN: Procalcitonin: <0.1 ng/mL

## 2021-08-12 LAB — CBC
HCT: 37.6 % — ABNORMAL LOW (ref 39.0–52.0)
Hemoglobin: 12.3 g/dL — ABNORMAL LOW (ref 13.0–17.0)
MCH: 31.2 pg (ref 26.0–34.0)
MCHC: 32.7 g/dL (ref 30.0–36.0)
MCV: 95.4 fL (ref 80.0–100.0)
Platelets: 152 10*3/uL (ref 150–400)
RBC: 3.94 MIL/uL — ABNORMAL LOW (ref 4.22–5.81)
RDW: 13.2 % (ref 11.5–15.5)
WBC: 4.8 10*3/uL (ref 4.0–10.5)
nRBC: 0 % (ref 0.0–0.2)

## 2021-08-12 LAB — BRAIN NATRIURETIC PEPTIDE: B Natriuretic Peptide: 127.5 pg/mL — ABNORMAL HIGH (ref 0.0–100.0)

## 2021-08-12 MED ORDER — IOHEXOL 350 MG/ML SOLN
100.0000 mL | Freq: Once | INTRAVENOUS | Status: AC | PRN
  Administered 2021-08-12: 100 mL via INTRAVENOUS
  Filled 2021-08-12: qty 100

## 2021-08-12 NOTE — ED Notes (Signed)
Says he had some short of breath when he woke up.  No chest pain, but pain in left shoulder that increases with movement.  He says breathing ios ok now.  He also says he has swelling of testicle for couple weeks.

## 2021-08-12 NOTE — Discharge Instructions (Addendum)
He can take Tylenol 1 g every 8 hours to help with any pain in the testicle but does not seem to be any signs of an infection and your blood work was overall very reassuring.  Follow up with cardiologist for shortness of breath and slightly elevated BNP.   Return to ER for any other concerns.

## 2021-08-12 NOTE — ED Triage Notes (Signed)
Pt in with co shob since this morning, no shob  noted at this time. States symptoms have resolved. Hx of the same with no findings.

## 2021-08-12 NOTE — ED Provider Notes (Signed)
[facility] Provider Note    Event Date/Time   First MD Initiated Contact with Patient 08/12/21 1053     (approximate)   History   Shortness of Breath   HPI  Kevin Burns is a 88 y.o. male with history of hypertension, hyperlipidemia, seizures who comes in with concerns for shortness of breath.  Patient reports having sudden onset of shortness of breath that started this morning.  States that he feels little bit better now but he has also been having a dry cough with it.  He reports a little bit of swelling his legs and into his left testicle discomfort.  He reports some pain in his left testicle as well.  This been going on for a few weeks.  He denies any falls, hitting his head or any other concerns.  He denies any falls, hitting his head.  States that he mostly feels better at this time.     Physical Exam   Triage Vital Signs: ED Triage Vitals  Enc Vitals Group     BP 08/12/21 0914 (!) 165/73     Pulse Rate 08/12/21 0914 (!) 57     Resp 08/12/21 0914 17     Temp 08/12/21 0914 98 F (36.7 C)     Temp Source 08/12/21 0914 Oral     SpO2 08/12/21 0914 99 %     Weight 08/12/21 0916 198 lb (89.8 kg)     Height 08/12/21 0916 5\' 6"  (1.676 m)     Head Circumference --      Peak Flow --      Pain Score 08/12/21 0916 0     Pain Loc --      Pain Edu? --      Excl. in [place]? --     Most recent vital signs: Vitals:   08/12/21 0914  BP: (!) 165/73  Pulse: (!) 57  Resp: 17  Temp: 98 F (36.7 C)  SpO2: 99%     General: Awake, no distress.  CV:  Good peripheral perfusion.  Resp:  Normal effort. Clear lungs  Abd:  No distention.  Other:  No discoloration of the testicles.  A little bit of tenderness on the left testicle but no obvious swelling noted.   ED Results / Procedures / Treatments   Labs (all labs ordered are listed, but only abnormal results are displayed) Labs Reviewed  CBC - Abnormal; Notable for the following components:      Result  Value   RBC 3.94 (*)    Hemoglobin 12.3 (*)    HCT 37.6 (*)    All other components within normal limits  COMPREHENSIVE METABOLIC PANEL - Abnormal; Notable for the following components:   Glucose, Bld 129 (*)    All other components within normal limits  RESP PANEL BY RT-PCR (FLU A&B, COVID) [id number]  PROCALCITONIN  BRAIN NATRIURETIC PEPTIDE  TROPONIN I (HIGH SENSITIVITY)  TROPONIN I (HIGH SENSITIVITY)     EKG  My interpretation of EKG:  Sinus bradycardia rate of 56 without any ST elevation or T wave inversions, right bundle branch block   RADIOLOGY I have reviewed the xray personally and there is a little bit of patchiness bilaterally that could be atelectasis versus infection    PROCEDURES:  Critical Care performed: No  Procedures   MEDICATIONS ORDERED IN ED: Medications - No data to display   IMPRESSION / MDM / [place] / ED COURSE  I reviewed the triage vital signs  and the nursing notes.                              Patient comes in with hypertension, hyperlipidemia who comes in with concerns for shortness of breath and left testicle pain.  Initial x-ray was concerning for possible atelectasis versus infection.  Patient is afebrile does not seem to be infectious in nature but given patient's age CT imaging was ordered evaluate for any pneumonia, edema or other acute pathology.  CT abdomen was also ordered given the testicle pain to make sure evidence of necrotizing fasciitis but my examination I do not see evidence of this.  Ultrasound was ordered to evaluate for any other cause for his testicle pain.  Patient does have a small left varicocele on the left side this could be causing a little bit of his pain.  Recommend Tylenol to help with but no evidence of mass or torsion  CT imaging is negative for PE and lungs are clear CT abdomen is negative  UA without evidence of UTI.  Cardiac markers are negative x2, COVID, flu are negative.  Hemoglobin is stable  from baseline and no white count elevation to suggest infection.  CMP looks good.  Procalcitonin was negative.  BNP slightly elevated which is nonspecific I do not see any significant swelling maybe some trace in his legs.  But do not want to start on Lasix given lack of constant hypotension we will have him follow-up with cardiology outpatient for evaluation.  Reevaluated patient denies any symptoms at this time feels comfortable with discharge home.  I considered admission given patient's age but given reassuring work-up I think it safe for discharge  Reevaluated patient he expressed understanding felt comfortable with discharge home       FINAL CLINICAL IMPRESSION(S) / ED DIAGNOSES   Final diagnoses:  Pain in left testicle  Shortness of breath  Varicocele     Rx / DC Orders   ED Discharge Orders     None        Note:  This document was prepared using Dragon voice recognition software and may include unintentional dictation errors.   Vanessa [place], MD 08/12/21 [phone]

## 2022-04-16 ENCOUNTER — Emergency Department: Payer: Medicare Other

## 2022-04-16 ENCOUNTER — Emergency Department
Admission: EM | Admit: 2022-04-16 | Discharge: 2022-04-16 | Disposition: A | Discharge status: Home / Self Care | Payer: Medicare Other | Attending: Emergency Medicine | Admitting: Emergency Medicine

## 2022-04-16 ENCOUNTER — Other Ambulatory Visit: Payer: Self-pay

## 2022-04-16 DIAGNOSIS — K219 Gastro-esophageal reflux disease without esophagitis: Secondary | ICD-10-CM | POA: Diagnosis not present

## 2022-04-16 DIAGNOSIS — M47892 Other spondylosis, cervical region: Secondary | ICD-10-CM | POA: Diagnosis not present

## 2022-04-16 DIAGNOSIS — Z20822 Contact with and (suspected) exposure to covid-19: Secondary | ICD-10-CM | POA: Insufficient documentation

## 2022-04-16 DIAGNOSIS — R079 Chest pain, unspecified: Secondary | ICD-10-CM | POA: Diagnosis present

## 2022-04-16 LAB — URINALYSIS, ROUTINE W REFLEX MICROSCOPIC
Bacteria, UA: NONE SEEN
Bilirubin Urine: NEGATIVE
Glucose, UA: NEGATIVE mg/dL
Hgb urine dipstick: NEGATIVE
Ketones, ur: NEGATIVE mg/dL
Nitrite: NEGATIVE
Protein, ur: NEGATIVE mg/dL
Specific Gravity, Urine: 1.005 (ref 1.005–1.030)
Squamous Epithelial / HPF: NONE SEEN (ref 0–5)
pH: 5 (ref 5.0–8.0)

## 2022-04-16 LAB — COMPREHENSIVE METABOLIC PANEL
ALT: 12 U/L (ref 0–44)
AST: 27 U/L (ref 15–41)
Albumin: 4.1 g/dL (ref 3.5–5.0)
Alkaline Phosphatase: 97 U/L (ref 38–126)
Anion gap: 6 (ref 5–15)
BUN: 24 mg/dL — ABNORMAL HIGH (ref 8–23)
CO2: 23 mmol/L (ref 22–32)
Calcium: 8.8 mg/dL — ABNORMAL LOW (ref 8.9–10.3)
Chloride: 100 mmol/L (ref 98–111)
Creatinine, Ser: 1.11 mg/dL (ref 0.61–1.24)
GFR, Estimated: >60 mL/min (ref >60)
Glucose, Bld: 119 mg/dL — ABNORMAL HIGH (ref 70–99)
Potassium: 3.9 mmol/L (ref 3.5–5.1)
Sodium: 129 mmol/L — ABNORMAL LOW (ref 135–145)
Total Bilirubin: 0.4 mg/dL (ref 0.3–1.2)
Total Protein: 7.4 g/dL (ref 6.5–8.1)

## 2022-04-16 LAB — CBC
HCT: 36.4 % — ABNORMAL LOW (ref 39.0–52.0)
Hemoglobin: 12.2 g/dL — ABNORMAL LOW (ref 13.0–17.0)
MCH: 31 pg (ref 26.0–34.0)
MCHC: 33.5 g/dL (ref 30.0–36.0)
MCV: 92.6 fL (ref 80.0–100.0)
Platelets: 171 10*3/uL (ref 150–400)
RBC: 3.93 MIL/uL — ABNORMAL LOW (ref 4.22–5.81)
RDW: 12.9 % (ref 11.5–15.5)
WBC: 4.9 10*3/uL (ref 4.0–10.5)
nRBC: 0 % (ref 0.0–0.2)

## 2022-04-16 LAB — SARS CORONAVIRUS 2 BY RT PCR: SARS Coronavirus 2 by RT PCR: NEGATIVE

## 2022-04-16 LAB — PHENYTOIN LEVEL, TOTAL: Phenytoin Lvl: 7 ug/mL — ABNORMAL LOW (ref 10.0–20.0)

## 2022-04-16 LAB — TROPONIN I (HIGH SENSITIVITY)
Troponin I (High Sensitivity): 10 ng/L (ref <18)
Troponin I (High Sensitivity): 11 ng/L (ref <18)

## 2022-04-16 LAB — LIPASE, BLOOD: Lipase: 45 U/L (ref 11–51)

## 2022-04-16 MED ORDER — PANTOPRAZOLE SODIUM 40 MG PO TBEC: 40.0000 mg | DELAYED_RELEASE_TABLET | Freq: Every day | ORAL | 1 refills | Status: DC

## 2022-04-16 MED ORDER — GAVISCON 80-14.2 MG PO CHEW: 1.0000 | CHEWABLE_TABLET | Freq: Two times a day (BID) | ORAL | 1 refills | Status: DC

## 2022-04-16 NOTE — Discharge Instructions (Addendum)
I think that you have reflux.  I will give you Protonix 1 pill a day that should help.  I will also give you some Gaviscon and acid tablets.  You can take 1 twice a day especially before bed if that is not enough she can take 2.  This makes an antiacid foam that will go up the esophagus and coat it protect it from the acid reflux.  Please follow-up with your primary care doctor in the next week.  Your doctor can check the Keppra and Dilantin levels.  Elevate the head of the bed frame with some cinderblocks like we talked about.  I would just use 1 cinderblock turned on end on either corner and possibly 1 in the middle so that they do not slip off of each other as they might if you stacked them 1 on top of the other.  Please return for worsening pain or any pain with exercise.

## 2022-04-16 NOTE — ED Triage Notes (Signed)
Pt to ED via POV from home. Pt reports diarrhea, abdominal pain and acid reflux x1 week.

## 2022-04-16 NOTE — ED Provider Notes (Signed)
[facility] Provider Note    Event Date/Time   First MD Initiated Contact with Patient 04/16/22 0745     (approximate)   History   Chest Pain   HPI  Kevin Burns is a 88 y.o. male who comes in with several complaints.  He reports his low back hurts when he stands up and is quite bad.  This been going on for quite some time.  He also reports pain in his neck when he turns it from side to side.  This is not pain radiating from anywhere.  His third complaint is that he has burning in his chest.  This is worse when he lays down.  Oftentimes he will get up in the middle of the night to rinse his mouth with Listerine because his mouth taste bad and it helps with the burning in his mouth.  But it comes from his stomach and goes all the way up into his mouth.  He reports to me that he cannot walk around and do things outside without any chest discomfort.  This discomfort comes when he lays down.  Patient is also having several episodes of diarrhea in the last week it seems to resolve now. Patient also had 1 episode of staring for possibly 17 to 20 seconds.  Son is worried that this may be a seizure.  He came out of the quickly and was oriented when he did so.  He seemed to be a little bit foggy for a short period of time afterwards.  He was however not confused or disoriented.  Son tells me that he has had these spells intermittently previously and that his neurologist Princella Ion is aware.     Physical Exam   Triage Vital Signs: ED Triage Vitals [04/16/22 0717]  Enc Vitals Group     BP (!) 152/75     Pulse Rate 60     Resp 18     Temp 97.6 F (36.4 C)     Temp Source Oral     SpO2 97 %     Weight      Height      Head Circumference      Peak Flow      Pain Score      Pain Loc      Pain Edu?      Excl. in [place]?     Most recent vital signs: Vitals:   04/16/22 0916 04/16/22 1100  BP:  (!) 147/67  Pulse: (!) 51 (!) 51  Resp: 18 18  Temp:  97.6 F (36.4 C)   SpO2: 99% 100%     General: Awake, no distress.  Neck is supple and nontender CV:  Good peripheral perfusion.  Heart regular rate and rhythm no audible murmurs Resp:  Normal effort.  Lungs are clear Abd:  Protuberant but nontender Extremities trace edema on the left patient reports he has had this for quite some time   ED Results / Procedures / Treatments   Labs (all labs ordered are listed, but only abnormal results are displayed) Labs Reviewed  CBC - Abnormal; Notable for the following components:      Result Value   RBC 3.93 (*)    Hemoglobin 12.2 (*)    HCT 36.4 (*)    All other components within normal limits  COMPREHENSIVE METABOLIC PANEL - Abnormal; Notable for the following components:   Sodium 129 (*)    Glucose, Bld 119 (*)  BUN 24 (*)    Calcium 8.8 (*)    All other components within normal limits  URINALYSIS, ROUTINE W REFLEX MICROSCOPIC - Abnormal; Notable for the following components:   Color, Urine STRAW (*)    APPearance CLEAR (*)    Leukocytes,Ua TRACE (*)    All other components within normal limits  SARS CORONAVIRUS 2 BY RT PCR  LIPASE, BLOOD  PHENYTOIN LEVEL, TOTAL  LEVETIRACETAM LEVEL  TROPONIN I (HIGH SENSITIVITY)  TROPONIN I (HIGH SENSITIVITY)     EKG  EKG read and interpreted by me shows sinus bradycardia rate of 53 left axis right bundle branch block no acute changes looks similar to prior EKG.   RADIOLOGY Chest x-ray read by radiology reviewed and interpreted by me shows no acute disease. Lumbar spine shows grade 1 anterolisthesis L4 on 5.  Radiology review the films and interpreted them and agree.  Seems to be worse radiology feels than previous film last year. 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 PROCEDURES:  Critical Care performed:   Procedures   MEDICATIONS ORDERED IN ED: Medications - No data to display   IMPRESSION / MDM / ASSESSMENT AND PLAN / ED COURSE  I  reviewed the triage vital signs and the nursing notes. ----------------------------------------- 11:29 AM on 04/16/2022 ----------------------------------------- Patient confirms that his pain is burning in with laying down not with exertion at all.  His Dilantin level apparently has not been done and they need more blood so I am going to have more blood drawn and we will let the patient go primary care can check on that.  I will give him some Protonix and some Gaviscon we talked about diet and elevating the head of the bed.  Also we talked about not eating late. Differential diagnosis includes, but is not limited to, cardiac disease of course but it does not sound like that it sounds more like reflux which I think it is.  It could be stomach ulcers or esophageal spasm as well.  Patient's presentation is most consistent with acute complicated illness / injury requiring diagnostic workup.  The patient is on the cardiac monitor to evaluate for evidence of arrhythmia and/or significant heart rate changes.  None have been seen     FINAL CLINICAL IMPRESSION(S) / ED DIAGNOSES   Final diagnoses:  Gastroesophageal reflux disease, unspecified whether esophagitis present     Rx / DC Orders   ED Discharge Orders          Ordered    pantoprazole (PROTONIX) 40 MG tablet  Daily        04/16/22 1127    Alum Hydroxide-Mag Trisilicate (GAVISCON) 80-14.2 MG CHEW  2 times daily        04/16/22 1127             Note:  This document was prepared using Dragon voice recognition software and may include unintentional dictation errors.   Arnaldo Natal, MD 04/16/22 1131

## 2022-04-17 LAB — LEVETIRACETAM LEVEL: Levetiracetam Lvl: 68.3 ug/mL — ABNORMAL HIGH (ref 10.0–40.0)

## 2022-07-24 ENCOUNTER — Other Ambulatory Visit: Payer: Self-pay | Admitting: Family Medicine

## 2022-07-24 DIAGNOSIS — G459 Transient cerebral ischemic attack, unspecified: Secondary | ICD-10-CM

## 2022-07-25 ENCOUNTER — Emergency Department
Admission: EM | Admit: 2022-07-25 | Discharge: 2022-07-25 | Disposition: A | Discharge status: Home / Self Care | Payer: 59 | Attending: Emergency Medicine | Admitting: Emergency Medicine

## 2022-07-25 ENCOUNTER — Other Ambulatory Visit: Payer: Self-pay

## 2022-07-25 ENCOUNTER — Emergency Department: Payer: 59

## 2022-07-25 DIAGNOSIS — R519 Headache, unspecified: Secondary | ICD-10-CM

## 2022-07-25 DIAGNOSIS — I10 Essential (primary) hypertension: Secondary | ICD-10-CM | POA: Diagnosis not present

## 2022-07-25 LAB — BASIC METABOLIC PANEL
Anion gap: 9 (ref 5–15)
BUN: 21 mg/dL (ref 8–23)
CO2: 25 mmol/L (ref 22–32)
Calcium: 9 mg/dL (ref 8.9–10.3)
Chloride: 100 mmol/L (ref 98–111)
Creatinine, Ser: 1.04 mg/dL (ref 0.61–1.24)
GFR, Estimated: >60 mL/min (ref >60)
Glucose, Bld: 106 mg/dL — ABNORMAL HIGH (ref 70–99)
Potassium: 4.6 mmol/L (ref 3.5–5.1)
Sodium: 134 mmol/L — ABNORMAL LOW (ref 135–145)

## 2022-07-25 LAB — CBC WITH DIFFERENTIAL/PLATELET
Abs Immature Granulocytes: 0.01 10*3/uL (ref 0.00–0.07)
Basophils Absolute: 0 10*3/uL (ref 0.0–0.1)
Basophils Relative: 1 %
Eosinophils Absolute: 0.1 10*3/uL (ref 0.0–0.5)
Eosinophils Relative: 2 %
HCT: 34.9 % — ABNORMAL LOW (ref 39.0–52.0)
Hemoglobin: 11.8 g/dL — ABNORMAL LOW (ref 13.0–17.0)
Immature Granulocytes: 0 %
Lymphocytes Relative: 37 %
Lymphs Abs: 1.6 10*3/uL (ref 0.7–4.0)
MCH: 31.5 pg (ref 26.0–34.0)
MCHC: 33.8 g/dL (ref 30.0–36.0)
MCV: 93.1 fL (ref 80.0–100.0)
Monocytes Absolute: 0.4 10*3/uL (ref 0.1–1.0)
Monocytes Relative: 10 %
Neutro Abs: 2.2 10*3/uL (ref 1.7–7.7)
Neutrophils Relative %: 50 %
Platelets: 147 10*3/uL — ABNORMAL LOW (ref 150–400)
RBC: 3.75 MIL/uL — ABNORMAL LOW (ref 4.22–5.81)
RDW: 12.8 % (ref 11.5–15.5)
WBC: 4.4 10*3/uL (ref 4.0–10.5)
nRBC: 0 % (ref 0.0–0.2)

## 2022-07-25 LAB — PHENYTOIN LEVEL, TOTAL: Phenytoin Lvl: 9.8 ug/mL — ABNORMAL LOW (ref 10.0–20.0)

## 2022-07-25 LAB — BRAIN NATRIURETIC PEPTIDE: B Natriuretic Peptide: 253.4 pg/mL — ABNORMAL HIGH (ref 0.0–100.0)

## 2022-07-25 MED ORDER — ACETAMINOPHEN 500 MG PO TABS
1000.0000 mg | ORAL_TABLET | Freq: Once | ORAL | Status: AC
  Administered 2022-07-25: 1000 mg via ORAL
  Filled 2022-07-25: qty 2

## 2022-07-25 NOTE — ED Triage Notes (Signed)
Pt sts that he has been having headaches and high BP. Pt sts that he has been worried about having these headaches for some time.

## 2022-07-25 NOTE — ED Provider Notes (Signed)
[facility] Provider Note    Event Date/Time   First MD Initiated Contact with Patient 07/25/22 1749     (approximate)   History   Chief Complaint Hypertension   HPI  Kevin Burns is a 89 y.o. male with past medical history of hypertension, hyperlipidemia, and seizures who presents to the ED complaining of elevated blood pressure.  History is limited as patient is Spanish-speaking only and history obtained with assistance of in person interpreter.  Patient reports that he has been dealing with waxing and waning headache across both sides of his frontal scalp for about the past month.  Pain does not seem to be exacerbated or alleviated by anything in particular and he denies any vision changes, speech changes, numbness, or weakness.  He has not noticed any fevers or neck stiffness, does state he has been taking Tylenol with partial relief of pain.  He has not had any photophobia, nausea, or vomiting.  Son reports he became increasingly concerned when the patient's blood pressure was found to be high earlier today.  Patient reports he typically takes his blood pressure medicine in the evening and has not yet had his dose today.     Physical Exam   Triage Vital Signs: ED Triage Vitals  Enc Vitals Group     BP 07/25/22 1628 (!) 175/71     Pulse Rate 07/25/22 1628 96     Resp 07/25/22 1628 18     Temp 07/25/22 1628 98.2 F (36.8 C)     Temp Source 07/25/22 1628 Oral     SpO2 07/25/22 1628 99 %     Weight 07/25/22 1627 206 lb (93.4 kg)     Height --      Head Circumference --      Peak Flow --      Pain Score 07/25/22 1627 0     Pain Loc --      Pain Edu? --      Excl. in [place]? --     Most recent vital signs: Vitals:   07/25/22 1628  BP: (!) 175/71  Pulse: 96  Resp: 18  Temp: 98.2 F (36.8 C)  SpO2: 99%    Constitutional: Alert and oriented. Eyes: Conjunctivae are normal.  Pupils equal, round, and reactive to light bilaterally. Head:  Atraumatic. Nose: No congestion/rhinnorhea. Mouth/Throat: Mucous membranes are moist.  Neck: Supple with no meningismus. Cardiovascular: Normal rate, regular rhythm. Grossly normal heart sounds.  2+ radial pulses bilaterally. Respiratory: Normal respiratory effort.  No retractions. Lungs CTAB. Gastrointestinal: Soft and nontender. No distention. Musculoskeletal: No lower extremity tenderness nor edema.  Neurologic:  Normal speech and language. No gross focal neurologic deficits are appreciated.    ED Results / Procedures / Treatments   Labs (all labs ordered are listed, but only abnormal results are displayed) Labs Reviewed  CBC WITH DIFFERENTIAL/PLATELET - Abnormal; Notable for the following components:      Result Value   RBC 3.75 (*)    Hemoglobin 11.8 (*)    HCT 34.9 (*)    Platelets 147 (*)    All other components within normal limits  PHENYTOIN LEVEL, TOTAL - Abnormal; Notable for the following components:   Phenytoin Lvl 9.8 (*)    All other components within normal limits  BASIC METABOLIC PANEL - Abnormal; Notable for the following components:   Sodium 134 (*)    Glucose, Bld 106 (*)    All other components within normal limits  BRAIN NATRIURETIC  PEPTIDE - Abnormal; Notable for the following components:   B Natriuretic Peptide 253.4 (*)    All other components within normal limits  CBC WITH DIFFERENTIAL/PLATELET     EKG  ED ECG REPORT I, Blake Divine, the attending physician, personally viewed and interpreted this ECG.   Date: 07/25/2022  EKG Time: 18:52  Rate: 52  Rhythm: sinus bradycardia  Axis: LAD  Intervals:right bundle branch block  ST&T Change: None  RADIOLOGY CT head reviewed and interpreted by me with no hemorrhage or midline shift.  PROCEDURES:  Critical Care performed: No  Procedures   MEDICATIONS ORDERED IN ED: Medications  acetaminophen (TYLENOL) tablet 1,000 mg (1,000 mg Oral Given 07/25/22 1855)     IMPRESSION / MDM /  ASSESSMENT AND PLAN / ED COURSE  I reviewed the triage vital signs and the nursing notes.                              89 y.o. male with past medical history of hypertension, hyperlipidemia, and seizures who presents to the ED for waxing and waning headache over the past month along with elevated blood pressure earlier today.  Patient's presentation is most consistent with acute presentation with potential threat to life or bodily function.  Differential diagnosis includes, but is not limited to, SAH, meningitis, intracranial mass, hypertensive emergency, stroke, electrolyte abnormality, AKI.  Patient well-appearing and in no acute distress, vital signs remarkable for mildly elevated blood pressure but otherwise reassuring.  Patient has no focal neurologic deficits on exam and headache has been waxing and waning for 1 month, overall low suspicion for [facility] but given his advanced age we will check CT head.  Will also screen EKG and labs, but no evidence of hypertensive emergency at this time.  CT imaging is negative for acute process, EKG shows no evidence of arrhythmia or ischemia.  Labs are unremarkable with no significant anemia, leukocytosis, lecture abnormality, or AKI.  We did check his Dilantin level at patient request, which is very slightly subtherapeutic.  Blood pressure remains elevated but no evidence of hypertensive emergency at this time and patient is appropriate for outpatient follow-up with his PCP for recheck.  He was counseled to return to the ED for new or worsening symptoms, patient and son agree with plan.      FINAL CLINICAL IMPRESSION(S) / ED DIAGNOSES   Final diagnoses:  Acute nonintractable headache, unspecified headache type  Uncontrolled hypertension     Rx / DC Orders   ED Discharge Orders     None        Note:  This document was prepared using Dragon voice recognition software and may include unintentional dictation errors.   Blake Divine,  MD 07/25/22 2055

## 2022-07-31 ENCOUNTER — Other Ambulatory Visit: Payer: Self-pay | Admitting: Family Medicine

## 2022-07-31 DIAGNOSIS — G459 Transient cerebral ischemic attack, unspecified: Secondary | ICD-10-CM

## 2022-08-02 ENCOUNTER — Ambulatory Visit
Admission: RE | Admit: 2022-08-02 | Discharge: 2022-08-02 | Disposition: A | Discharge status: Home / Self Care | Payer: 59 | Source: Ambulatory Visit | Attending: Family Medicine | Admitting: Family Medicine

## 2022-08-02 DIAGNOSIS — G459 Transient cerebral ischemic attack, unspecified: Secondary | ICD-10-CM

## 2022-09-27 ENCOUNTER — Ambulatory Visit (INDEPENDENT_AMBULATORY_CARE_PROVIDER_SITE_OTHER): Payer: 59 | Admitting: Podiatry

## 2022-09-27 DIAGNOSIS — M79674 Pain in right toe(s): Secondary | ICD-10-CM

## 2022-09-27 DIAGNOSIS — B351 Tinea unguium: Secondary | ICD-10-CM

## 2022-09-27 DIAGNOSIS — M79675 Pain in left toe(s): Secondary | ICD-10-CM | POA: Diagnosis not present

## 2022-09-27 DIAGNOSIS — E119 Type 2 diabetes mellitus without complications: Secondary | ICD-10-CM

## 2022-10-02 ENCOUNTER — Encounter: Payer: Self-pay | Admitting: Podiatry

## 2022-10-02 NOTE — Progress Notes (Signed)
  Subjective:  Patient ID: Kevin Burns, male    DOB: 03/10/1932,  MRN: [id number]  Chief Complaint  Patient presents with   Diabetes    New pt- Needs to start DFC--scheduled interpretor 3/21    89 y.o. male presents with the above complaint. History confirmed with patient.  Here with an interpreter and his sons.  He says his diabetes is well-controlled.  They are having difficulty cutting his nails because of the thickness and discoloration and texture  Objective:  Physical Exam: warm, good capillary refill, no trophic changes or ulcerative lesions, normal DP and PT pulses, and normal sensory exam. Left Foot: dystrophic yellowed discolored nail plates with subungual debris Right Foot: dystrophic yellowed discolored nail plates with subungual debris   Assessment:   1. Pain due to onychomycosis of toenails of both feet   2. Encounter for diabetic foot exam      Plan:  Patient was evaluated and treated and all questions answered.  Patient educated on diabetes. Discussed proper diabetic foot care and discussed risks and complications of disease. Educated patient in depth on reasons to return to the office immediately should he/she discover anything concerning or new on the feet. All questions answered. Discussed proper shoes as well.   Discussed the etiology and treatment options for the condition in detail with the patient. Educated patient on the topical and oral treatment options for mycotic nails. Recommended debridement of the nails today. Sharp and mechanical debridement performed of all painful and mycotic nails today. Nails debrided in length and thickness using a nail nipper to level of comfort. Discussed treatment options including appropriate shoe gear. Follow up as needed for painful nails.    Return in about 3 months (around 12/27/2022), or if symptoms worsen or fail to improve, for painful thick fungal nails.

## 2022-11-14 ENCOUNTER — Encounter: Payer: Self-pay | Admitting: Emergency Medicine

## 2022-11-14 DIAGNOSIS — E039 Hypothyroidism, unspecified: Secondary | ICD-10-CM | POA: Insufficient documentation

## 2022-11-14 DIAGNOSIS — I10 Essential (primary) hypertension: Secondary | ICD-10-CM | POA: Diagnosis present

## 2022-11-14 LAB — COMPREHENSIVE METABOLIC PANEL
ALT: 11 U/L (ref 0–44)
AST: 30 U/L (ref 15–41)
Albumin: 4.2 g/dL (ref 3.5–5.0)
Alkaline Phosphatase: 79 U/L (ref 38–126)
Anion gap: 9 (ref 5–15)
BUN: 16 mg/dL (ref 8–23)
CO2: 20 mmol/L — ABNORMAL LOW (ref 22–32)
Calcium: 8.7 mg/dL — ABNORMAL LOW (ref 8.9–10.3)
Chloride: 99 mmol/L (ref 98–111)
Creatinine, Ser: 0.98 mg/dL (ref 0.61–1.24)
GFR, Estimated: >60 mL/min (ref >60)
Glucose, Bld: 119 mg/dL — ABNORMAL HIGH (ref 70–99)
Potassium: 3.9 mmol/L (ref 3.5–5.1)
Sodium: 128 mmol/L — ABNORMAL LOW (ref 135–145)
Total Bilirubin: 0.7 mg/dL (ref 0.3–1.2)
Total Protein: 7.6 g/dL (ref 6.5–8.1)

## 2022-11-14 LAB — URINALYSIS, ROUTINE W REFLEX MICROSCOPIC
Bilirubin Urine: NEGATIVE
Glucose, UA: NEGATIVE mg/dL
Hgb urine dipstick: NEGATIVE
Ketones, ur: NEGATIVE mg/dL
Nitrite: NEGATIVE
Protein, ur: NEGATIVE mg/dL
Specific Gravity, Urine: 1.005 (ref 1.005–1.030)
pH: 6 (ref 5.0–8.0)

## 2022-11-14 LAB — CBC WITH DIFFERENTIAL/PLATELET
Abs Immature Granulocytes: 0.01 10*3/uL (ref 0.00–0.07)
Basophils Absolute: 0 10*3/uL (ref 0.0–0.1)
Basophils Relative: 0 %
Eosinophils Absolute: 0.1 10*3/uL (ref 0.0–0.5)
Eosinophils Relative: 1 %
HCT: 37.9 % — ABNORMAL LOW (ref 39.0–52.0)
Hemoglobin: 12.8 g/dL — ABNORMAL LOW (ref 13.0–17.0)
Immature Granulocytes: 0 %
Lymphocytes Relative: 29 %
Lymphs Abs: 1.6 10*3/uL (ref 0.7–4.0)
MCH: 31.7 pg (ref 26.0–34.0)
MCHC: 33.8 g/dL (ref 30.0–36.0)
MCV: 93.8 fL (ref 80.0–100.0)
Monocytes Absolute: 0.5 10*3/uL (ref 0.1–1.0)
Monocytes Relative: 9 %
Neutro Abs: 3.2 10*3/uL (ref 1.7–7.7)
Neutrophils Relative %: 61 %
Platelets: 180 10*3/uL (ref 150–400)
RBC: 4.04 MIL/uL — ABNORMAL LOW (ref 4.22–5.81)
RDW: 12.6 % (ref 11.5–15.5)
WBC: 5.3 10*3/uL (ref 4.0–10.5)
nRBC: 0 % (ref 0.0–0.2)

## 2022-11-14 NOTE — ED Triage Notes (Signed)
Pt presents ambulatory to triage via POV with complaints of HTN. He notes having range of 133/75, 200/95, 192/100, 202/95 over the course of the day. Pt has been compliant with his medications. No other complaints at this time. He contacted his PCP who informed him that he should come to the ED for a possible adjustment on meds until he can be seen in the office. A&Ox4 at this time. Denies dizziness, vision changes, weakness, headache, CP or SOB.

## 2022-11-15 ENCOUNTER — Emergency Department
Admission: EM | Admit: 2022-11-15 | Discharge: 2022-11-15 | Disposition: A | Discharge status: Home / Self Care | Payer: 59 | Attending: Emergency Medicine | Admitting: Emergency Medicine

## 2022-11-15 DIAGNOSIS — I10 Essential (primary) hypertension: Secondary | ICD-10-CM

## 2022-11-15 MED ORDER — LOSARTAN POTASSIUM 50 MG PO TABS: 50.0000 mg | ORAL_TABLET | Freq: Every day | ORAL | 11 refills | Status: DC

## 2022-11-15 NOTE — Discharge Instructions (Addendum)
Your blood pressure was elevated in the emergency department but you are not having any symptoms from it.    Please increase your losartan from 50 mg daily to 50 mg once in the morning and once at night (100mg  total).  I have written you another prescription for the 50 mg dose to take in the morning.  You can continue to take the pills in your pillbox as scheduled.  Please continue to check your blood pressure daily.  Please follow-up with your primary care doctor on Friday for reassessment.

## 2022-11-15 NOTE — ED Provider Notes (Signed)
[facility] Provider Note    Event Date/Time   First MD Initiated Contact with Patient 11/15/22 0012     (approximate)   History   Hypertension   HPI  Kevin Burns is a 89 y.o. male with past medical history of hypertension hyperlipidemia hypothyroidism GERD who presents with elevated blood pressure.  Patient checks his blood pressure daily, says it is typically well-controlled but since today it has been spiking.  Initially was in the 130s then went up to the 200s.  He takes metoprolol 100 mg XL and losartan 50 mg nightly which he took this afternoon as a result of the elevated blood pressure.  Denies headache chest pain vision change numbness tingling weakness back pain or abdominal pain.  Says he feels fine now.  His doctor told him to come to the ER to potentially have his medications adjusted.  He has a primary care appointment this Friday      Past Medical History:  Diagnosis Date   GERD (gastroesophageal reflux disease)    Hyperlipidemia    Hypertension    Hypothyroidism    Seasonal allergies    Seizures [facility])     Patient Active Problem List   Diagnosis Date Noted   Rotator cuff tear arthropathy of right shoulder 12/18/2018     Physical Exam  Triage Vital Signs: ED Triage Vitals  Enc Vitals Group     BP 11/14/22 1905 (!) 227/92     Pulse Rate 11/14/22 1905 63     Resp 11/14/22 1905 18     Temp 11/14/22 1905 98.5 F (36.9 C)     Temp Source 11/15/22 0009 Oral     SpO2 11/14/22 1905 97 %     Weight 11/14/22 1905 208 lb 5.4 oz (94.5 kg)     Height 11/14/22 1905 5\' 6"  (1.676 m)     Head Circumference --      Peak Flow --      Pain Score 11/14/22 1904 0     Pain Loc --      Pain Edu? --      Excl. in [place]? --     Most recent vital signs: Vitals:   11/14/22 1909 11/15/22 0009  BP: (!) 231/77 (!) 186/78  Pulse:  60  Resp:  19  Temp:  (!) 97.5 F (36.4 C)  SpO2:  99%     General: Awake, no distress.  CV:  Good peripheral  perfusion.  Resp:  Normal effort.  Lung sounds are clear Abd:  No distention.  Neuro:             Awake, Alert, Oriented x 3  Other:     ED Results / Procedures / Treatments  Labs (all labs ordered are listed, but only abnormal results are displayed) Labs Reviewed  COMPREHENSIVE METABOLIC PANEL - Abnormal; Notable for the following components:      Result Value   Sodium 128 (*)    CO2 20 (*)    Glucose, Bld 119 (*)    Calcium 8.7 (*)    All other components within normal limits  CBC WITH DIFFERENTIAL/PLATELET - Abnormal; Notable for the following components:   RBC 4.04 (*)    Hemoglobin 12.8 (*)    HCT 37.9 (*)    All other components within normal limits  URINALYSIS, ROUTINE W REFLEX MICROSCOPIC - Abnormal; Notable for the following components:   Color, Urine STRAW (*)    APPearance CLEAR (*)  Leukocytes,Ua LARGE (*)    Bacteria, UA RARE (*)    All other components within normal limits     EKG  EKG reviewed interpreted myself shows sinus bradycardia with a right bundle branch block, left axis deviation no acute ischemic changes   RADIOLOGY    PROCEDURES:  Critical Care performed: No  Procedures     MEDICATIONS ORDERED IN ED: Medications - No data to display   IMPRESSION / MDM / ASSESSMENT AND PLAN / ED COURSE  I reviewed the triage vital signs and the nursing notes.                              Patient's presentation is most consistent with acute complicated illness / injury requiring diagnostic workup.  Differential diagnosis includes, but is not limited to, asymptomatic hypertension, medication noncompliance, hypertensive emergency  The patient is a 89 year old male who presents because of elevated blood pressure at home.  Patient checks his blood pressure daily is typically well-controlled.  Today had spikes up into the 200s.  He is essentially asymptomatic has not had symptoms of hypertensive emergency or endorgan damage including no headache  chest pain back pain focal neurologic deficit or vision change.  Initial BP here is elevated up to the 230s systolic.  On repeat it is 186/78.  The rest of his vitals are stable.  On my exam patient looks well he is alert and oriented.  He has no acute complaints at this time.  Lung sounds are clear does not appear volume overloaded.  EKG and basic labs were sent from triage.  EKG is nonischemic CBC and CMP are reassuring just notable for mild hyponatremia.  A urinalysis was sent which shows 11-20 white cells and rare bacteria but patient is asymptomatic.  Says he had some itching of the penis and is prescribed medicine for this but denies any dysuria as I have low suspicion for true UTI at this time.  Essentially patient is presenting with asymptomatic hypertension.  It is a little bit unusual that patient's blood pressure is elevated today but has typically been well-controlled.  I recommended that in the interim if still running high he increase the losartan to 50 mg twice daily.  Recommend he continue to track his blood pressure daily and keep his appointment with his PCP in 2 days to have repeat blood pressure check.  Otherwise he is appropriate for discharge.  We discussed signs of endorgan damage which would be reasons to return to the ED.       FINAL CLINICAL IMPRESSION(S) / ED DIAGNOSES   Final diagnoses:  Hypertension, unspecified type     Rx / DC Orders   ED Discharge Orders          Ordered    losartan (COZAAR) 50 MG tablet  Daily        11/15/22 0041             Note:  This document was prepared using Dragon voice recognition software and may include unintentional dictation errors.   Georga Hacking, MD 11/15/22 [phone]

## 2022-11-15 NOTE — ED Notes (Signed)
Discharge instructions provided by edp were reinforced to pt. Pt verbalized understanding with no additional questions at this time. Pt was able to verbalized understanding of medication adjustment.

## 2022-11-15 NOTE — ED Notes (Signed)
Pt arrives from triage at this time. Pt continues to deny s/s. No headache/dizziness. Sts he feels per his usual state of health. Vs updated.

## 2022-11-24 ENCOUNTER — Emergency Department
Admission: EM | Admit: 2022-11-24 | Discharge: 2022-11-24 | Disposition: A | Discharge status: Home / Self Care | Payer: 59 | Attending: Emergency Medicine | Admitting: Emergency Medicine

## 2022-11-24 ENCOUNTER — Emergency Department: Payer: 59

## 2022-11-24 ENCOUNTER — Other Ambulatory Visit: Payer: Self-pay

## 2022-11-24 DIAGNOSIS — R001 Bradycardia, unspecified: Secondary | ICD-10-CM | POA: Insufficient documentation

## 2022-11-24 DIAGNOSIS — R0789 Other chest pain: Secondary | ICD-10-CM | POA: Diagnosis present

## 2022-11-24 DIAGNOSIS — I10 Essential (primary) hypertension: Secondary | ICD-10-CM | POA: Insufficient documentation

## 2022-11-24 LAB — CBC WITH DIFFERENTIAL/PLATELET
Abs Immature Granulocytes: 0.01 10*3/uL (ref 0.00–0.07)
Basophils Absolute: 0 10*3/uL (ref 0.0–0.1)
Basophils Relative: 1 %
Eosinophils Absolute: 0.1 10*3/uL (ref 0.0–0.5)
Eosinophils Relative: 2 %
HCT: 35.5 % — ABNORMAL LOW (ref 39.0–52.0)
Hemoglobin: 11.9 g/dL — ABNORMAL LOW (ref 13.0–17.0)
Immature Granulocytes: 0 %
Lymphocytes Relative: 30 %
Lymphs Abs: 1.2 10*3/uL (ref 0.7–4.0)
MCH: 31.7 pg (ref 26.0–34.0)
MCHC: 33.5 g/dL (ref 30.0–36.0)
MCV: 94.7 fL (ref 80.0–100.0)
Monocytes Absolute: 0.4 10*3/uL (ref 0.1–1.0)
Monocytes Relative: 10 %
Neutro Abs: 2.3 10*3/uL (ref 1.7–7.7)
Neutrophils Relative %: 57 %
Platelets: 180 10*3/uL (ref 150–400)
RBC: 3.75 MIL/uL — ABNORMAL LOW (ref 4.22–5.81)
RDW: 12.8 % (ref 11.5–15.5)
WBC: 4 10*3/uL (ref 4.0–10.5)
nRBC: 0 % (ref 0.0–0.2)

## 2022-11-24 LAB — COMPREHENSIVE METABOLIC PANEL
ALT: 12 U/L (ref 0–44)
AST: 32 U/L (ref 15–41)
Albumin: 3.9 g/dL (ref 3.5–5.0)
Alkaline Phosphatase: 83 U/L (ref 38–126)
Anion gap: 8 (ref 5–15)
BUN: 18 mg/dL (ref 8–23)
CO2: 25 mmol/L (ref 22–32)
Calcium: 8.6 mg/dL — ABNORMAL LOW (ref 8.9–10.3)
Chloride: 99 mmol/L (ref 98–111)
Creatinine, Ser: 1.04 mg/dL (ref 0.61–1.24)
GFR, Estimated: >60 mL/min (ref >60)
Glucose, Bld: 100 mg/dL — ABNORMAL HIGH (ref 70–99)
Potassium: 3.9 mmol/L (ref 3.5–5.1)
Sodium: 132 mmol/L — ABNORMAL LOW (ref 135–145)
Total Bilirubin: 0.5 mg/dL (ref 0.3–1.2)
Total Protein: 7.1 g/dL (ref 6.5–8.1)

## 2022-11-24 NOTE — ED Triage Notes (Addendum)
Pt presents to ED with c/o of bilateral shoulder pain. Pt denies falling. Pt states this has been ongoing for 2 weeks.   Pt states pain is between shoulder blades.

## 2022-11-24 NOTE — ED Notes (Signed)
See triage note  Presents with bilateral shoulder pain  states pain started about 2 weeks ago w/o injury   Pain is mainly between shoulder blades  No deformity noted

## 2022-11-24 NOTE — ED Provider Notes (Signed)
[facility] Emergency Department Provider Note     Event Date/Time   First MD Initiated Contact with Patient 11/24/22 1510     (approximate)   History   Shoulder Pain   HPI  Kevin Burns is a 89 y.o. male with a history of hypertension, hyperlipidemia and GERD who presents to the ED with complaint of chest discomfort x 4 days with radiation to bilateral shoulders.  He is currently in no pain at this time.  He was told by his primary care to be evaluated today in the ED until he can be seen in office.  No injuries or traumas.  Patient endorses right shoulder surgery, but does not believe this is correlated with the discomfort he has been having in his chest.  He denies fever, recent illnesses, shortness of breath, headache, abdominal pain, back pain, dizziness, visual changes, leg swelling, and abnormal urination or bowel movement.    Spanish interpreter used for assistance in communication.  Physical Exam   Triage Vital Signs: ED Triage Vitals  Enc Vitals Group     BP 11/24/22 1411 (!) 176/96     Pulse Rate 11/24/22 1411 (!) 56     Resp 11/24/22 1411 18     Temp 11/24/22 1411 97.8 F (36.6 C)     Temp Source 11/24/22 1411 Oral     SpO2 11/24/22 1411 100 %     Weight --      Height --      Head Circumference --      Peak Flow --      Pain Score 11/24/22 1412 8     Pain Loc --      Pain Edu? --      Excl. in GC? --     Most recent vital signs: Vitals:   11/24/22 1411  BP: (!) 176/96  Pulse: (!) 56  Resp: 18  Temp: 97.8 F (36.6 C)  SpO2: 100%   General: Well appearing. Alert and oriented. INAD.  HEENT NCAT. PERRL. EOMI. conjunctivae clear. No rhinorrhea. Mucous membranes are moist.    CV:  Good peripheral perfusion. RRR. No peripheral edema.  RESP:  Normal effort. LCTAB. No retractions.  ABD:  No distention.  Soft.  Nontender to palpation in all 4 quadrants. BACK:  Spinous process is midline without deformity or tenderness. No CVA  tenderness. NEURO: Cranial nerves II-XII intact. No focal deficits. Sensation and motor function intact.  OTHER:  Limited ROM in right shoulder.  Shoulders are nontender upon palpation bilaterally.    ED Results / Procedures / Treatments   Labs (all labs ordered are listed, but only abnormal results are displayed) Labs Reviewed  CBC WITH DIFFERENTIAL/PLATELET - Abnormal; Notable for the following components:      Result Value   RBC 3.75 (*)    Hemoglobin 11.9 (*)    HCT 35.5 (*)    All other components within normal limits  COMPREHENSIVE METABOLIC PANEL - Abnormal; Notable for the following components:   Sodium 132 (*)    Glucose, Bld 100 (*)    Calcium 8.6 (*)    All other components within normal limits   EKG  Unchanged from 05/22 ED visit. No septal infarct. Sinu bradycardia and Right Bundle Branch Block   RADIOLOGY I personally viewed and evaluated these images as part of my medical decision making, as well as reviewing the written report by the radiologist.  ED Provider Interpretation: Unremarkable.  DG Chest 2 View  Result Date: 11/24/2022 CLINICAL DATA:  Chest pain EXAM: CHEST - 2 VIEW COMPARISON:  Chest x-ray dated April 16, 2022 FINDINGS: Cardiac and mediastinal contours are unchanged. Lungs are clear. Pleural effusion or pneumothorax. Total right shoulder replacement. IMPRESSION: No active cardiopulmonary disease. Electronically Signed   By: Allegra Lai M.D.   On: 11/24/2022 16:35     PROCEDURES:  Critical Care performed: No  Procedures   MEDICATIONS ORDERED IN ED: Medications - No data to display   IMPRESSION / MDM / ASSESSMENT AND PLAN / ED COURSE  I reviewed the triage vital signs and the nursing notes.                              89 y.o. male with a past medical history of hypertension, hyperlipidemia, GERD presents to the emergency department for evaluation and treatment of chest discomfort without present pain. vital signs are centrally  normal with the exception of mildly elevated BP of 176/96.  This BP reading is reassuring compared to patient's trend.  See HPI for further details.   Differential diagnosis includes, but is not limited to, ACS, aortic dissection, pulmonary embolism, pneumothorax, pneumonia, pericarditis, peripheral vascular disease, GI-related causes including esophagitis/gastritis, and musculoskeletal chest wall pain.    Patient's presentation is most consistent with acute presentation with potential threat to life or bodily function.  Patient is well-appearing and quite pleasant during physical evaluation. He is INAD and reports no pain. Overall, his physical exam findings are benign and reassuring of end organ damage.  No focal deficits.  Lungs are clear. There are no signs of peripheral edema or vascular deficiency of lower limbs to indicate peripheral vascular disease.  Due to his age, I will obtain a CBC with differential, CMP and chest x-ray.   EKG is unchanged. CBC and CMP are reassuring of any active acute cardiopulmonary at this time.  Patent is up and moving around in ED hallways. Essentially the patients presentation is clinically consistent to asymptomatic hypertension.  He will be discharged with instructions to follow-up with cardiology on Monday and with his primary care following. Patient is given strict ED precautions to return to the ED for any worsening or new symptoms. We discussed signs and symptoms of endorgan damage to closely monitor at home such as chest pain, shortness of breath, leg swelling, and headaches.  Patient verbalizes understanding and agrees with assessment and plan.   Clinical Course as of 11/24/22 1716  Fri Nov 24, 2022  1525 Chest hurts runs to back and left arm and other arm hurts and radiates to elbow. X 4 days. No pain. Unable to describe feeling. Was told by primary care to be evaluated. No signs of organ damage. Denies HA, Fever, Abd pain, Dizziness. Right Shoulder surgery.  Will obtain lab work and chest xray  [MH]  1603 ED EKG Sinus brady. RBB.  [MH]    Clinical Course User Index [MH] Romeo Apple, Vinessa Macconnell A, PA-C    FINAL CLINICAL IMPRESSION(S) / ED DIAGNOSES   Final diagnoses:  Hypertension, unspecified type     Rx / DC Orders   ED Discharge Orders     None        Note:  This document was prepared using Dragon voice recognition software and may include unintentional dictation errors.    Romeo Apple, Kaliegh Willadsen A, PA-C 11/24/22 1716    Dionne Bucy, MD 11/24/22 1924

## 2022-11-24 NOTE — Discharge Instructions (Addendum)
Please follow up with Cardiology. Contact information is provided for you.

## 2022-12-25 ENCOUNTER — Other Ambulatory Visit: Payer: Self-pay | Admitting: Internal Medicine

## 2022-12-25 DIAGNOSIS — R0789 Other chest pain: Secondary | ICD-10-CM

## 2023-01-04 ENCOUNTER — Ambulatory Visit (INDEPENDENT_AMBULATORY_CARE_PROVIDER_SITE_OTHER): Payer: 59 | Admitting: Podiatry

## 2023-01-04 DIAGNOSIS — Z91199 Patient's noncompliance with other medical treatment and regimen due to unspecified reason: Secondary | ICD-10-CM

## 2023-01-04 NOTE — Progress Notes (Signed)
1. No-show for appointment     

## 2023-01-16 ENCOUNTER — Telehealth (HOSPITAL_COMMUNITY): Payer: Self-pay | Admitting: Emergency Medicine

## 2023-01-16 NOTE — Telephone Encounter (Signed)
Attempted to call patient regarding upcoming cardiac CT appointment. Left message on voicemail with name and callback number Rockwell Alexandria RN Navigator Cardiac Imaging [facility] [phone] Office [phone] Cell  [employer] interpre[employer] used to leave vm

## 2023-01-17 ENCOUNTER — Other Ambulatory Visit: Payer: Self-pay | Admitting: Internal Medicine

## 2023-01-17 ENCOUNTER — Ambulatory Visit
Admission: RE | Admit: 2023-01-17 | Discharge: 2023-01-17 | Disposition: A | Discharge status: Home / Self Care | Payer: Medicare (Managed Care) | Source: Ambulatory Visit | Attending: Internal Medicine | Admitting: Internal Medicine

## 2023-01-17 DIAGNOSIS — R0789 Other chest pain: Secondary | ICD-10-CM

## 2023-01-17 DIAGNOSIS — I700 Atherosclerosis of aorta: Secondary | ICD-10-CM | POA: Diagnosis not present

## 2023-01-17 DIAGNOSIS — I2510 Atherosclerotic heart disease of native coronary artery without angina pectoris: Secondary | ICD-10-CM | POA: Insufficient documentation

## 2023-01-17 DIAGNOSIS — R9389 Abnormal findings on diagnostic imaging of other specified body structures: Secondary | ICD-10-CM | POA: Insufficient documentation

## 2023-01-17 LAB — POCT I-STAT CREATININE: Creatinine, Ser: 1.2 mg/dL (ref 0.61–1.24)

## 2023-01-17 MED ORDER — IOHEXOL 350 MG/ML SOLN
80.0000 mL | Freq: Once | INTRAVENOUS | Status: AC | PRN
  Administered 2023-01-17: 80 mL via INTRAVENOUS

## 2023-01-17 MED ORDER — NITROGLYCERIN 0.4 MG SL SUBL
0.8000 mg | SUBLINGUAL_TABLET | Freq: Once | SUBLINGUAL | Status: AC
  Administered 2023-01-17: 0.8 mg via SUBLINGUAL
  Filled 2023-01-17: qty 25

## 2023-01-17 NOTE — Progress Notes (Signed)

## 2023-05-01 ENCOUNTER — Ambulatory Visit: Payer: Medicare (Managed Care) | Admitting: Urology

## 2023-05-17 ENCOUNTER — Ambulatory Visit: Payer: Medicare (Managed Care) | Admitting: Urology

## 2023-06-04 ENCOUNTER — Encounter: Payer: Self-pay | Admitting: Urology

## 2023-06-04 ENCOUNTER — Ambulatory Visit (INDEPENDENT_AMBULATORY_CARE_PROVIDER_SITE_OTHER): Payer: Medicare (Managed Care) | Admitting: Urology

## 2023-06-04 VITALS — Ht 66.0 in | Wt 200.0 lb

## 2023-06-04 DIAGNOSIS — R8281 Pyuria: Secondary | ICD-10-CM

## 2023-06-04 DIAGNOSIS — N50819 Testicular pain, unspecified: Secondary | ICD-10-CM

## 2023-06-04 DIAGNOSIS — N5082 Scrotal pain: Secondary | ICD-10-CM

## 2023-06-04 LAB — URINALYSIS, COMPLETE
Bilirubin, UA: NEGATIVE
Glucose, UA: NEGATIVE
Ketones, UA: NEGATIVE
Nitrite, UA: NEGATIVE
Protein,UA: NEGATIVE
RBC, UA: NEGATIVE
Specific Gravity, UA: 1.01 (ref 1.005–1.030)
Urobilinogen, Ur: 0.2 mg/dL (ref 0.2–1.0)
pH, UA: 6 (ref 5.0–7.5)

## 2023-06-04 LAB — MICROSCOPIC EXAMINATION

## 2023-06-04 MED ORDER — SILODOSIN 8 MG PO CAPS: 8.0000 mg | ORAL_CAPSULE | Freq: Every day | ORAL | 0 refills | Status: AC

## 2023-06-04 MED ORDER — SILODOSIN 8 MG PO CAPS: 8.0000 mg | ORAL_CAPSULE | Freq: Every day | ORAL | 0 refills | Status: DC

## 2023-06-04 NOTE — Progress Notes (Signed)
I, Kevin Burns, acting as a scribe for Kevin Altes, MD., have documented all relevant documentation on the behalf of Kevin Altes, MD, as directed by Kevin Altes, MD while in the presence of Kevin Altes, MD.  06/04/2023 4:24 PM   Kevin Burns 14-Jul-1931 [id number]  Referring provider: [facility] [address] [place],  [place] [address]  Chief Complaint  Patient presents with   Testicle Pain    HPI: Kevin Burns is a 89 y.o. male presents for evaluation of scrotal pain. He presents the day with his son, who served as an Equities trader. He declined Spanish interpretive via video link.   States he has had intermittent pain for 3 months. He was also evaluated in the ED in February 2023 for left scrotal pain. He had a negative CT of the abdomen pelvis and ultrasound. There was no evidence of hernia.  He does have urinary frequency and nocturia. Scrotal ultrasound showed no abnormalities.   PMH: Past Medical History:  Diagnosis Date   GERD (gastroesophageal reflux disease)    Hyperlipidemia    Hypertension    Hypothyroidism    Seasonal allergies    Seizures (HCC)     Surgical History: Past Surgical History:  Procedure Laterality Date   JOINT REPLACEMENT     right knee   REPLACEMENT TOTAL KNEE     right   TOTAL SHOULDER ARTHROPLASTY Right 12/18/2018   Procedure: TOTAL SHOULDER ARTHROPLASTY;  Surgeon: Lyndle Herrlich, MD;  Location: [facility] ORS;  Service: Orthopedics;  Laterality: Right;    Home Medications:  Allergies as of 06/04/2023   No Known Allergies      Medication List        Accurate as of June 04, 2023  4:24 PM. If you have any questions, ask your nurse or doctor.          albuterol 108 (90 Base) MCG/ACT inhaler Commonly known as: VENTOLIN HFA Inhale 2 puffs into the lungs every 4 (four) hours as needed for wheezing or shortness of breath. Every 4-6 hours as needed for wheezing or cough What  changed: Another medication with the same name was removed. Continue taking this medication, and follow the directions you see here. Changed by: Kevin Burns   amLODipine 10 MG tablet Commonly known as: NORVASC Take 1 tablet by mouth daily.   aspirin 81 MG chewable tablet Chew 81 mg by mouth daily.   atorvastatin 80 MG tablet Commonly known as: LIPITOR Take 1 tablet by mouth daily.   bisacodyl 5 MG EC tablet Commonly known as: DULCOLAX Take 5 mg by mouth daily as needed for moderate constipation. 1-3 as needed for constipation   clopidogrel 75 MG tablet Commonly known as: PLAVIX Take 75 mg by mouth daily.   docusate sodium 100 MG capsule Commonly known as: COLACE Take 100 mg by mouth 2 (two) times daily.   eucerin cream Apply topically as needed for dry skin.   Flovent HFA 110 MCG/ACT inhaler Generic drug: fluticasone Inhale 2 puffs into the lungs 2 (two) times daily.   fluticasone 50 MCG/ACT nasal spray Commonly known as: FLONASE Place 2 sprays into both nostrils daily.   furosemide 20 MG tablet Commonly known as: LASIX Take 20 mg by mouth daily.   Gaviscon 80-14.2 MG Chew Generic drug: Alum Hydroxide-Mag Trisilicate Chew 1 tablet by mouth 2 (two) times daily.   hydrOXYzine 50 MG capsule Commonly known as: Vistaril Take 1 capsule (  50 mg total) by mouth 3 (three) times daily as needed.   levETIRAcetam 500 MG tablet Commonly known as: KEPPRA Take 5 tablets by mouth daily. 2 tabs in the AM and three in the PM   levothyroxine 112 MCG tablet Commonly known as: SYNTHROID Take 112 mcg by mouth daily before breakfast.   losartan 50 MG tablet Commonly known as: Cozaar Take 1 tablet (50 mg total) by mouth daily.   melatonin 1 MG Tabs tablet Take 5 mg by mouth at bedtime as needed.   metFORMIN 500 MG tablet Commonly known as: GLUCOPHAGE Take 500 mg by mouth 2 (two) times daily with a meal.   metFORMIN 500 MG 24 hr tablet Commonly known as:  GLUCOPHAGE-XR Take 500 mg by mouth 2 (two) times daily.   metoprolol succinate 100 MG 24 hr tablet Commonly known as: TOPROL-XL Take 50 mg by mouth daily.   naphazoline-glycerin 0.012-0.2 % Soln Commonly known as: CLEAR EYES REDNESS 1-2 drops 4 (four) times daily as needed for eye irritation.   naproxen 500 MG tablet Commonly known as: NAPROSYN Take 500 mg by mouth 2 (two) times daily as needed.   Olopatadine HCl 0.2 % Soln Apply to eye once. 1 gtt once daily   omeprazole 40 MG capsule Commonly known as: PRILOSEC Take 20 mg by mouth daily.   omeprazole 20 MG capsule Commonly known as: PRILOSEC Take 20 mg by mouth daily.   pantoprazole 40 MG tablet Commonly known as: Protonix Take 1 tablet (40 mg total) by mouth daily.   phenytoin 100 MG ER capsule Commonly known as: DILANTIN Take 3 capsules by mouth daily. 2 in AM and 1 at HS   polyethylene glycol 17 g packet Commonly known as: MIRALAX / GLYCOLAX Take 17 g by mouth daily.   silodosin 8 MG Caps capsule Commonly known as: RAPAFLO Take 1 capsule (8 mg total) by mouth daily with breakfast. Started by: Kevin Burns   Systane Balance 0.6 % Soln Generic drug: Propylene Glycol Apply to eye daily as needed. 2-3 drops   tamsulosin 0.4 MG Caps capsule Commonly known as: FLOMAX Take 1 capsule by mouth daily.   traZODone 50 MG tablet Commonly known as: DESYREL Take 50 mg by mouth at bedtime as needed for sleep. 1/2 tab   triamcinolone cream 0.5 % Commonly known as: KENALOG Apply 1 application topically daily.   triamcinolone cream 0.1 % Commonly known as: KENALOG Apply 1 application topically 4 (four) times daily.   triamterene-hydrochlorothiazide 37.5-25 MG tablet Commonly known as: MAXZIDE-25 Take 1 tablet by mouth daily.   Vascepa 1 g capsule Generic drug: icosapent Ethyl Take 2 tablets by mouth 2 (two) times a day.        Allergies: No Known Allergies  Social History:  reports that he has never  smoked. He has never used smokeless tobacco. He reports that he does not drink alcohol and does not use drugs.   Physical Exam: Ht 5\' 6"  (1.676 m)   Wt 200 lb (90.7 kg)   BMI 32.28 kg/m   Constitutional:  Alert and oriented, No acute distress. HEENT: [place] AT, moist mucus membranes.  Trachea midline, no masses. Cardiovascular: No clubbing, cyanosis, or edema. Respiratory: Normal respiratory effort, no increased work of breathing. GI: Abdomen is soft, nontender, nondistended, no abdominal masses GU: Phallus uncircumcised without lesions. Testes descended by lateral masses are turning this period patient is obese but I do not appreciate inguinal hernia.   Skin: No rashes, bruises or suspicious  lesions. Neurologic: Grossly intact, no focal deficits, moving all 4 extremities. Psychiatric: Normal mood and affect.   Urinalysis Dipstick trace leukocytes, microscopy 6-10 WBC    Pertinent Imaging: CT was personally reviewed and interpreted.   CT  EXAM: CT ABDOMEN AND PELVIS WITH CONTRAST   TECHNIQUE: Multidetector CT imaging of the abdomen and pelvis was performed using the standard protocol following bolus administration of intravenous contrast.   RADIATION DOSE REDUCTION: This exam was performed according to the departmental dose-optimization program which includes automated exposure control, adjustment of the mA and/or kV according to patient size and/or use of iterative reconstruction technique.   CONTRAST:  OMNIPAQUE IOHEXOL 350 MG/ML SOLN   COMPARISON:  None.   FINDINGS: Lower chest: No acute findings. Heart is enlarged. Atherosclerotic calcification of the aorta and coronary arteries. No pericardial or pleural effusion. Distal esophagus is grossly unremarkable.   Hepatobiliary: Low-attenuation lesions in the liver measure up to 2.6 cm in the left hepatic lobe and are likely cysts. Liver and gallbladder are otherwise unremarkable. No biliary ductal dilatation.    Pancreas: Negative.   Spleen: Negative.   Adrenals/Urinary Tract: Adrenal glands are unremarkable. Subcentimeter low-attenuation lesions in the kidneys are too small to characterize. Kidneys are otherwise unremarkable. Ureters are decompressed. Bladder is grossly unremarkable.   Stomach/Bowel: Stomach, small bowel, appendix and colon are unremarkable.   Vascular/Lymphatic: Atherosclerotic calcification of the aorta. No pathologically enlarged lymph nodes.   Reproductive: Prostate is minimally prominent.   Other: No free fluid.  Mesenteries and peritoneum are unremarkable.   Musculoskeletal: Degenerative changes in the spine. Minimal grade 1 anterolisthesis of L4 on L5.   IMPRESSION: 1. No findings to explain the patient's clinical history. 2. Aortic atherosclerosis (ICD10-I70.0). Coronary artery calcification.     Electronically Signed   By: Leanna Battles M.D.   On: 08/12/2021 12:55   Assessment & Plan:   1. Left scrotal pain-chronic I had a detailed discussion with patient and his son that scrotal pain is common and in a majority of cases, we do not know exactly what causes the pain.  Based on imaging, he was reassured there is nothing serious going on such as a tumor or cancer.  We discussed possibility of referred pain secondary to prostatic inflammation. His urine today shows mild pyuria and urine culture is ordered.  Trial silodosin 8 mg daily x30 days.  [facility] [address], [address] [place], [place] [address] [phone]

## 2023-06-06 ENCOUNTER — Encounter: Payer: Self-pay | Admitting: Urology

## 2023-06-07 LAB — CULTURE, URINE COMPREHENSIVE

## 2023-08-03 ENCOUNTER — Other Ambulatory Visit: Payer: Self-pay | Admitting: Orthopedic Surgery

## 2023-08-03 DIAGNOSIS — M545 Low back pain, unspecified: Secondary | ICD-10-CM

## 2023-08-03 DIAGNOSIS — M4807 Spinal stenosis, lumbosacral region: Secondary | ICD-10-CM

## 2023-09-18 ENCOUNTER — Other Ambulatory Visit: Payer: Self-pay

## 2023-09-18 DIAGNOSIS — G40909 Epilepsy, unspecified, not intractable, without status epilepticus: Secondary | ICD-10-CM | POA: Insufficient documentation

## 2023-09-18 DIAGNOSIS — Z79899 Other long term (current) drug therapy: Secondary | ICD-10-CM | POA: Diagnosis not present

## 2023-09-18 DIAGNOSIS — Z7901 Long term (current) use of anticoagulants: Secondary | ICD-10-CM | POA: Diagnosis not present

## 2023-09-18 DIAGNOSIS — E039 Hypothyroidism, unspecified: Secondary | ICD-10-CM | POA: Diagnosis not present

## 2023-09-18 DIAGNOSIS — I160 Hypertensive urgency: Principal | ICD-10-CM | POA: Insufficient documentation

## 2023-09-18 NOTE — ED Triage Notes (Signed)
 Pt arrives via [facility] with CC of high blood pressure. Pt reports he has been taking BP medications as prescribed. Denies CP, SOB, and dizziness. BP 156/64 in triage. Pt alert and oriented at this time.

## 2023-09-19 ENCOUNTER — Emergency Department: Payer: Medicare (Managed Care)

## 2023-09-19 ENCOUNTER — Observation Stay
Admission: EM | Admit: 2023-09-19 | Discharge: 2023-09-20 | Disposition: A | Discharge status: Home / Self Care | Payer: Medicare (Managed Care) | Attending: Internal Medicine | Admitting: Internal Medicine

## 2023-09-19 ENCOUNTER — Encounter: Payer: Self-pay | Admitting: Internal Medicine

## 2023-09-19 DIAGNOSIS — I160 Hypertensive urgency: Secondary | ICD-10-CM | POA: Diagnosis present

## 2023-09-19 DIAGNOSIS — I10 Essential (primary) hypertension: Principal | ICD-10-CM

## 2023-09-19 LAB — COMPREHENSIVE METABOLIC PANEL
ALT: 16 U/L (ref 0–44)
AST: 31 U/L (ref 15–41)
Albumin: 3.9 g/dL (ref 3.5–5.0)
Alkaline Phosphatase: 79 U/L (ref 38–126)
Anion gap: 7 (ref 5–15)
BUN: 26 mg/dL — ABNORMAL HIGH (ref 8–23)
CO2: 23 mmol/L (ref 22–32)
Calcium: 8.8 mg/dL — ABNORMAL LOW (ref 8.9–10.3)
Chloride: 105 mmol/L (ref 98–111)
Creatinine, Ser: 1.3 mg/dL — ABNORMAL HIGH (ref 0.61–1.24)
GFR, Estimated: 53 mL/min — ABNORMAL LOW (ref >60)
Glucose, Bld: 124 mg/dL — ABNORMAL HIGH (ref 70–99)
Potassium: 3.9 mmol/L (ref 3.5–5.1)
Sodium: 135 mmol/L (ref 135–145)
Total Bilirubin: 0.3 mg/dL (ref 0.0–1.2)
Total Protein: 7.4 g/dL (ref 6.5–8.1)

## 2023-09-19 LAB — CBC WITH DIFFERENTIAL/PLATELET
Abs Immature Granulocytes: 0 10*3/uL (ref 0.00–0.07)
Basophils Absolute: 0 10*3/uL (ref 0.0–0.1)
Basophils Relative: 1 %
Eosinophils Absolute: 0.1 10*3/uL (ref 0.0–0.5)
Eosinophils Relative: 2 %
HCT: 35 % — ABNORMAL LOW (ref 39.0–52.0)
Hemoglobin: 11.5 g/dL — ABNORMAL LOW (ref 13.0–17.0)
Immature Granulocytes: 0 %
Lymphocytes Relative: 25 %
Lymphs Abs: 1.2 10*3/uL (ref 0.7–4.0)
MCH: 31.3 pg (ref 26.0–34.0)
MCHC: 32.9 g/dL (ref 30.0–36.0)
MCV: 95.4 fL (ref 80.0–100.0)
Monocytes Absolute: 0.5 10*3/uL (ref 0.1–1.0)
Monocytes Relative: 10 %
Neutro Abs: 2.9 10*3/uL (ref 1.7–7.7)
Neutrophils Relative %: 62 %
Platelets: 163 10*3/uL (ref 150–400)
RBC: 3.67 MIL/uL — ABNORMAL LOW (ref 4.22–5.81)
RDW: 13.1 % (ref 11.5–15.5)
WBC: 4.7 10*3/uL (ref 4.0–10.5)
nRBC: 0 % (ref 0.0–0.2)

## 2023-09-19 LAB — CREATININE, SERUM
Creatinine, Ser: 1.17 mg/dL (ref 0.61–1.24)
GFR, Estimated: 60 mL/min — ABNORMAL LOW (ref >60)

## 2023-09-19 LAB — URINALYSIS, ROUTINE W REFLEX MICROSCOPIC
Bilirubin Urine: NEGATIVE
Glucose, UA: NEGATIVE mg/dL
Hgb urine dipstick: NEGATIVE
Ketones, ur: NEGATIVE mg/dL
Leukocytes,Ua: NEGATIVE
Nitrite: NEGATIVE
Protein, ur: NEGATIVE mg/dL
Specific Gravity, Urine: 1.008 (ref 1.005–1.030)
pH: 5 (ref 5.0–8.0)

## 2023-09-19 LAB — TROPONIN I (HIGH SENSITIVITY)
Troponin I (High Sensitivity): 14 ng/L (ref <18)
Troponin I (High Sensitivity): 17 ng/L (ref <18)

## 2023-09-19 MED ORDER — POLYETHYLENE GLYCOL 3350 17 G PO PACK
17.0000 g | PACK | Freq: Every day | ORAL | Status: DC
  Administered 2023-09-19 – 2023-09-20 (×2): 17 g via ORAL
  Filled 2023-09-19 (×2): qty 1

## 2023-09-19 MED ORDER — LEVETIRACETAM 500 MG PO TABS
1500.0000 mg | ORAL_TABLET | Freq: Every day | ORAL | Status: DC
  Administered 2023-09-19: 1500 mg via ORAL
  Filled 2023-09-19 (×2): qty 3

## 2023-09-19 MED ORDER — LOSARTAN POTASSIUM 50 MG PO TABS
100.0000 mg | ORAL_TABLET | Freq: Every day | ORAL | Status: DC
  Administered 2023-09-20: 100 mg via ORAL
  Filled 2023-09-19: qty 2

## 2023-09-19 MED ORDER — LORATADINE 10 MG PO TABS
10.0000 mg | ORAL_TABLET | Freq: Every day | ORAL | Status: DC
  Administered 2023-09-20: 10 mg via ORAL
  Filled 2023-09-19: qty 1

## 2023-09-19 MED ORDER — HYDROCHLOROTHIAZIDE 25 MG PO TABS: 25.0000 mg | ORAL_TABLET | Freq: Every day | ORAL | Status: DC

## 2023-09-19 MED ORDER — ATORVASTATIN CALCIUM 20 MG PO TABS
80.0000 mg | ORAL_TABLET | Freq: Every day | ORAL | Status: DC
  Administered 2023-09-20: 80 mg via ORAL
  Filled 2023-09-19: qty 4

## 2023-09-19 MED ORDER — TAMSULOSIN HCL 0.4 MG PO CAPS
0.4000 mg | ORAL_CAPSULE | Freq: Every day | ORAL | Status: DC
  Administered 2023-09-20: 0.4 mg via ORAL
  Filled 2023-09-19: qty 1

## 2023-09-19 MED ORDER — HYDRALAZINE HCL 50 MG PO TABS: 25.0000 mg | ORAL_TABLET | Freq: Four times a day (QID) | ORAL | Status: DC | PRN

## 2023-09-19 MED ORDER — ENOXAPARIN SODIUM 40 MG/0.4ML IJ SOSY
40.0000 mg | PREFILLED_SYRINGE | INTRAMUSCULAR | Status: DC
  Administered 2023-09-19: 40 mg via SUBCUTANEOUS
  Filled 2023-09-19: qty 0.4

## 2023-09-19 MED ORDER — ACETAMINOPHEN 325 MG PO TABS: 650.0000 mg | ORAL_TABLET | Freq: Four times a day (QID) | ORAL | Status: DC | PRN

## 2023-09-19 MED ORDER — LOSARTAN POTASSIUM 50 MG PO TABS
50.0000 mg | ORAL_TABLET | Freq: Once | ORAL | Status: AC
  Administered 2023-09-19: 50 mg via ORAL
  Filled 2023-09-19: qty 1

## 2023-09-19 MED ORDER — AMLODIPINE BESYLATE 5 MG PO TABS
10.0000 mg | ORAL_TABLET | Freq: Once | ORAL | Status: AC
  Administered 2023-09-19: 10 mg via ORAL
  Filled 2023-09-19: qty 2

## 2023-09-19 MED ORDER — FUROSEMIDE 40 MG PO TABS
20.0000 mg | ORAL_TABLET | Freq: Once | ORAL | Status: AC
  Administered 2023-09-19: 20 mg via ORAL
  Filled 2023-09-19: qty 1

## 2023-09-19 MED ORDER — BISACODYL 5 MG PO TBEC: 5.0000 mg | DELAYED_RELEASE_TABLET | Freq: Every day | ORAL | Status: DC | PRN

## 2023-09-19 MED ORDER — ALBUTEROL SULFATE (2.5 MG/3ML) 0.083% IN NEBU: 2.5000 mg | INHALATION_SOLUTION | RESPIRATORY_TRACT | Status: DC | PRN

## 2023-09-19 MED ORDER — METOPROLOL SUCCINATE ER 50 MG PO TB24
50.0000 mg | ORAL_TABLET | Freq: Every day | ORAL | Status: DC
  Administered 2023-09-20: 50 mg via ORAL
  Filled 2023-09-19: qty 1

## 2023-09-19 MED ORDER — AMLODIPINE BESYLATE 10 MG PO TABS
10.0000 mg | ORAL_TABLET | Freq: Every day | ORAL | Status: DC
  Administered 2023-09-20: 10 mg via ORAL
  Filled 2023-09-19: qty 1

## 2023-09-19 MED ORDER — LEVETIRACETAM 500 MG PO TABS
1000.0000 mg | ORAL_TABLET | Freq: Every day | ORAL | Status: DC
  Administered 2023-09-20: 1000 mg via ORAL
  Filled 2023-09-19: qty 2

## 2023-09-19 MED ORDER — LEVOTHYROXINE SODIUM 112 MCG PO TABS
112.0000 ug | ORAL_TABLET | Freq: Every day | ORAL | Status: DC
  Administered 2023-09-20: 112 ug via ORAL
  Filled 2023-09-19: qty 1

## 2023-09-19 MED ORDER — HYDRALAZINE HCL 50 MG PO TABS
25.0000 mg | ORAL_TABLET | Freq: Three times a day (TID) | ORAL | Status: DC
  Administered 2023-09-19 – 2023-09-20 (×3): 25 mg via ORAL
  Filled 2023-09-19 (×3): qty 1

## 2023-09-19 MED ORDER — CLONIDINE HCL 0.1 MG PO TABS
0.1000 mg | ORAL_TABLET | Freq: Once | ORAL | Status: AC
  Administered 2023-09-19: 0.1 mg via ORAL
  Filled 2023-09-19: qty 1

## 2023-09-19 MED ORDER — DOCUSATE SODIUM 100 MG PO CAPS
100.0000 mg | ORAL_CAPSULE | Freq: Two times a day (BID) | ORAL | Status: DC
  Administered 2023-09-19 – 2023-09-20 (×2): 100 mg via ORAL
  Filled 2023-09-19 (×2): qty 1

## 2023-09-19 MED ORDER — ACETAMINOPHEN 650 MG RE SUPP: 650.0000 mg | Freq: Four times a day (QID) | RECTAL | Status: DC | PRN

## 2023-09-19 MED ORDER — POLYETHYLENE GLYCOL 3350 17 G PO PACK: 17.0000 g | PACK | Freq: Every day | ORAL | Status: DC | PRN

## 2023-09-19 MED ORDER — PANTOPRAZOLE SODIUM 40 MG PO TBEC
40.0000 mg | DELAYED_RELEASE_TABLET | Freq: Every day | ORAL | Status: DC
  Administered 2023-09-20: 40 mg via ORAL
  Filled 2023-09-19: qty 1

## 2023-09-19 MED ORDER — ONDANSETRON HCL 4 MG PO TABS: 4.0000 mg | ORAL_TABLET | Freq: Four times a day (QID) | ORAL | Status: DC | PRN

## 2023-09-19 MED ORDER — ONDANSETRON HCL 4 MG/2ML IJ SOLN: 4.0000 mg | Freq: Four times a day (QID) | INTRAMUSCULAR | Status: DC | PRN

## 2023-09-19 MED ORDER — MELATONIN 5 MG PO TABS
5.0000 mg | ORAL_TABLET | Freq: Every evening | ORAL | Status: DC | PRN
Start: 2023-09-19 — End: 2023-09-20

## 2023-09-19 MED ORDER — TRIAMTERENE-HCTZ 37.5-25 MG PO TABS
1.0000 | ORAL_TABLET | Freq: Every day | ORAL | Status: DC
  Administered 2023-09-20: 1 via ORAL
  Filled 2023-09-19: qty 1

## 2023-09-19 NOTE — ED Notes (Signed)
 Pt requesting to sit in bedside chair, pt wants to be near bedside toilet, pt refusing to keep monitoring equipment on at this time.   Pt given coffee per request.

## 2023-09-19 NOTE — ED Notes (Signed)
 Paged transport at this time to transport pt to assigned in-patient room

## 2023-09-19 NOTE — ED Notes (Signed)
 This tech provided pt with oral hygiene supplies, sandwich tray, and a cup of coffee per pts request. Pt with no further needs at this time.

## 2023-09-19 NOTE — ED Notes (Signed)
 Pt assisted with urinal at bedside, pt placed back on cardiac monitor, call bell in reach. Pt denies any further needs. NAD noted.

## 2023-09-19 NOTE — H&P (Signed)
 History and Physical:    Kevin Burns   OZH:[id number] DOB: 12-17-1931 DOA: 09/19/2023  Referring MD/provider: Artis Delay, MD PCP: [facility]   Patient coming from: Home  Chief Complaint: High blood pressure  History of Present Illness:   Kevin Burns is a 89 y.o. male with medical history significant for seizure disorder, hypertension, hypothyroidism, hyperlipidemia, GERD, seasonal allergies, who presented to the ED because of elevated blood pressure.  He said his BP has been elevated despite taking his antihypertensives as prescribed.  He did not report any symptoms.  No chest pain, palpitations, dizziness, headache, confusion, nausea, vomiting, syncope, loss of consciousness, fever, seizure.  ED Course: Initial BP in the ED was 156/64.  While in the ED, BP went as high as 211/118.  The patient was given amlodipine, clonidine, Lasix and losartan in the ED  ROS:   ROS all other systems reviewed were negative.  Past Medical History:   Past Medical History:  Diagnosis Date   GERD (gastroesophageal reflux disease)    Hyperlipidemia    Hypertension    Hypothyroidism    Seasonal allergies    Seizures (HCC)     Past Surgical History:   Past Surgical History:  Procedure Laterality Date   JOINT REPLACEMENT     right knee   REPLACEMENT TOTAL KNEE     right   TOTAL SHOULDER ARTHROPLASTY Right 12/18/2018   Procedure: TOTAL SHOULDER ARTHROPLASTY;  Surgeon: Lyndle Herrlich, MD;  Location: [facility] ORS;  Service: Orthopedics;  Laterality: Right;    Social History:   Social History   Socioeconomic History   Marital status: Widowed    Spouse name: Not on file   Number of children: Not on file   Years of education: Not on file   Highest education level: Not on file  Occupational History   Not on file  Tobacco Use   Smoking status: Never   Smokeless tobacco: Never  Vaping Use   Vaping status: Never Used  Substance and Sexual Activity   Alcohol  use: No   Drug use: Never   Sexual activity: Not on file  Other Topics Concern   Not on file  Social History Narrative   Not on file   Social Drivers of Health   Financial Resource Strain: Not on file  Food Insecurity: Not on file  Transportation Needs: Not on file  Physical Activity: Not on file  Stress: Not on file  Social Connections: Not on file  Intimate Partner Violence: Not on file    Allergies   Patient has no known allergies.  Family history:   History reviewed. No pertinent family history.  Current Medications:   Prior to Admission medications   Medication Sig Start Date End Date Taking? Authorizing Provider  albuterol (VENTOLIN HFA) 108 (90 Base) MCG/ACT inhaler Inhale 2 puffs into the lungs every 4 (four) hours as needed for wheezing or shortness of breath. Every 4-6 hours as needed for wheezing or cough    [provider]  Alum Hydroxide-Mag Trisilicate (GAVISCON) 80-14.2 MG CHEW Chew 1 tablet by mouth 2 (two) times daily. 04/16/22   Arnaldo Natal, MD  amLODipine (NORVASC) 10 MG tablet Take 1 tablet by mouth daily. 11/17/14   [provider]  aspirin 81 MG chewable tablet Chew 81 mg by mouth daily.    [provider]  atorvastatin (LIPITOR) 80 MG tablet Take 1 tablet by mouth daily. 11/18/14   [provider]  bisacodyl (DULCOLAX)  5 MG EC tablet Take 5 mg by mouth daily as needed for moderate constipation. 1-3 as needed for constipation    [provider]  clopidogrel (PLAVIX) 75 MG tablet Take 75 mg by mouth daily.    [provider]  docusate sodium (COLACE) 100 MG capsule Take 100 mg by mouth 2 (two) times daily.    [provider]  FLOVENT HFA 110 MCG/ACT inhaler Inhale 2 puffs into the lungs 2 (two) times daily. 06/29/21   [provider]  fluticasone (FLONASE) 50 MCG/ACT nasal spray Place 2 sprays into both nostrils daily.    [provider]  furosemide (LASIX) 20 MG tablet Take  20 mg by mouth daily.    [provider]  hydrOXYzine (VISTARIL) 50 MG capsule Take 1 capsule (50 mg total) by mouth 3 (three) times daily as needed. 01/21/20   Cuthriell, Delorise Royals, PA-C  levETIRAcetam (KEPPRA) 500 MG tablet Take 5 tablets by mouth daily. 2 tabs in the AM and three in the PM 11/17/14   [provider]  levothyroxine (SYNTHROID) 112 MCG tablet Take 112 mcg by mouth daily before breakfast. 04/14/16   [provider]  losartan (COZAAR) 50 MG tablet Take 1 tablet (50 mg total) by mouth daily. 11/15/22 12/15/22  Georga Hacking, MD  Melatonin 1 MG TABS Take 5 mg by mouth at bedtime as needed.    [provider]  metFORMIN (GLUCOPHAGE) 500 MG tablet Take 500 mg by mouth 2 (two) times daily with a meal.    [provider]  metFORMIN (GLUCOPHAGE-XR) 500 MG 24 hr tablet Take 500 mg by mouth 2 (two) times daily. 07/27/21   [provider]  metoprolol succinate (TOPROL-XL) 100 MG 24 hr tablet Take 50 mg by mouth daily.  11/17/14   [provider]  naphazoline-glycerin (CLEAR EYES REDNESS) 0.012-0.2 % SOLN 1-2 drops 4 (four) times daily as needed for eye irritation.    [provider]  naproxen (NAPROSYN) 500 MG tablet Take 500 mg by mouth 2 (two) times daily as needed.    [provider]  Olopatadine HCl 0.2 % SOLN Apply to eye once. 1 gtt once daily    [provider]  omeprazole (PRILOSEC) 20 MG capsule Take 20 mg by mouth daily. 07/27/21   [provider]  omeprazole (PRILOSEC) 40 MG capsule Take 20 mg by mouth daily.  11/19/14   [provider]  pantoprazole (PROTONIX) 40 MG tablet Take 1 tablet (40 mg total) by mouth daily. 04/16/22 04/16/23  Arnaldo Natal, MD  phenytoin (DILANTIN) 100 MG ER capsule Take 3 capsules by mouth daily. 2 in AM and 1 at [facility] 11/17/14   [provider]  polyethylene glycol (MIRALAX / GLYCOLAX) 17 g packet Take 17 g by mouth daily.    [provider]  Propylene Glycol (SYSTANE BALANCE) 0.6 % SOLN Apply to eye daily as needed. 2-3 drops    [provider]  silodosin (RAPAFLO) 8 MG CAPS capsule Take 1 capsule (8 mg total) by mouth daily with breakfast. 06/04/23   Stoioff, Verna Czech, MD  Skin Protectants, Misc. (EUCERIN) cream Apply topically as needed for dry skin. 01/21/20   Cuthriell, Delorise Royals, PA-C  tamsulosin (FLOMAX) 0.4 MG CAPS capsule Take 1 capsule by mouth daily. 11/17/14   [provider]  traZODone (DESYREL) 50 MG tablet Take 50 mg by mouth at bedtime as needed for sleep. 1/2 tab    [provider]  triamcinolone cream (KENALOG) 0.1 % Apply 1 application topically 4 (four) times daily. 01/21/20   Cuthriell, Delorise Royals, PA-C  triamcinolone cream (KENALOG) 0.5 % Apply 1 application topically daily.    [provider]  triamterene-hydrochlorothiazide (MAXZIDE-25) 37.5-25 MG per tablet Take 1 tablet by mouth daily. 11/19/14   [provider]  VASCEPA 1 G CAPS Take 2 tablets by mouth 2 (two) times a day.  11/18/14   [provider]    Physical Exam:   Vitals:   09/19/23 0705 09/19/23 0800 09/19/23 0830 09/19/23 0900  BP: (!) 173/69 (!) 173/83 (!) 177/131 (!) 160/57  Pulse: (!) 51 (!) 51 73 (!) 56  Resp: 20 17 (!) 26 16  Temp:      TempSrc:      SpO2: 99% 94% 100% 96%  Weight:      Height:         Physical Exam: Blood pressure (!) 160/57, pulse (!) 56, temperature 98.5 F (36.9 C), temperature source Oral, resp. rate 16, height 5\' 5"  (1.651 m), weight 84.8 kg, SpO2 96%. Gen: No acute distress. Head: Normocephalic, atraumatic. Eyes: Pupils equal, round and reactive to light. Extraocular movements intact.  Sclerae nonicteric.  Mouth: Moist mucous membranes. Neck: Supple, no thyromegaly, no lymphadenopathy, no jugular venous distention. Chest: Lungs are clear to auscultation with good air movement. No rales, rhonchi or wheezes.  CV: Heart sounds are regular with an  S1, S2. No murmurs, rubs or gallops.  Abdomen: Soft, nontender,protuberant with normal active bowel sounds. No palpable masses. Extremities: Extremities are without clubbing, or cyanosis. No edema. Pedal pulses 2+.  Skin: Warm and dry. Neuro: Alert and oriented times 3; grossly nonfocal.  Psych: Insight is good and judgment is appropriate. Mood and affect normal.   Data Review:    Labs: Basic Metabolic Panel: Recent Labs  Lab 09/18/23 2345 09/19/23 0702  NA 135  --   K 3.9  --   CL 105  --   CO2 23  --   GLUCOSE 124*  --   BUN 26*  --   CREATININE 1.30* 1.17  CALCIUM 8.8*  --    Liver Function Tests: Recent Labs  Lab 09/18/23 2345  AST 31  ALT 16  ALKPHOS 79  BILITOT 0.3  PROT 7.4  ALBUMIN 3.9   No results for input(s): "LIPASE", "AMYLASE" in the last 168 hours. No results for input(s): "AMMONIA" in the last 168 hours. CBC: Recent Labs  Lab 09/18/23 2345  WBC 4.7  NEUTROABS 2.9  HGB 11.5*  HCT 35.0*  MCV 95.4  PLT 163   Cardiac Enzymes: No results for input(s): "CKTOTAL", "CKMB", "CKMBINDEX", "TROPONINI" in the last 168 hours.  BNP (last 3 results) No results for input(s): "PROBNP" in the last 8760 hours. CBG: No results for input(s): "GLUCAP" in the last 168 hours.  Urinalysis    Component Value Date/Time   COLORURINE STRAW (A) 09/19/2023 0631   APPEARANCEUR CLEAR (A) 09/19/2023 0631   APPEARANCEUR Clear 06/04/2023 1308   LABSPEC 1.008 09/19/2023 0631   PHURINE 5.0 09/19/2023 0631   GLUCOSEU NEGATIVE 09/19/2023 0631   HGBUR NEGATIVE 09/19/2023 0631   BILIRUBINUR NEGATIVE 09/19/2023 0631   BILIRUBINUR Negative 06/04/2023 1308   KETONESUR NEGATIVE 09/19/2023 0631   PROTEINUR NEGATIVE 09/19/2023 0631   NITRITE NEGATIVE 09/19/2023 0631   LEUKOCYTESUR NEGATIVE 09/19/2023 0631      Radiographic Studies: DG Chest Port 1 View Result Date: 09/19/2023 CLINICAL DATA:  High blood pressure EXAM: PORTABLE CHEST  1 VIEW COMPARISON:  11/24/2022  FINDINGS: Cardiac shadow is stable. Aortic calcifications are noted. The lungs are clear bilaterally. Postsurgical changes in the right shoulder are seen. IMPRESSION: No acute abnormality noted. Electronically Signed   By: Alcide Clever M.D.   On: 09/19/2023 04:01    EKG: Independently reviewed by me showed normal sinus rhythm, right bundle branch block.    Assessment/Plan:   Principal Problem:   Hypertensive urgency    Body mass index is 31.12 kg/m.   Hypertensive urgency: Admit to MedSurg for observation.  Continue home antihypertensives (amlodipine, hydralazine, losartan, metoprolol and triamterene/HCTZ).  Monitor BP closely.  Use p.o. hydralazine as needed for severe hypertension   Seizure disorder: Continue Keppra   Hypothyroidism: Continue Synthroid       Other information:   DVT prophylaxis: Lovenox  Code Status: Full code. Family Communication: None Disposition Plan: Plan to discharge home tomorrow Consults called: None Admission status: Patient    Rejoice Heatwole [facility] Pager: Please check [email or web address]   How to contact the [facility] Attending or Consulting provider 7A - 7P or covering provider during after hours 7P -7A, for this patient?   Check the care team in [facility] and look for a) attending/consulting [facility] provider listed and b) the [facility] team listed Log into [email or web address] and use [facility]'s universal password to access. If you do not have the password, please contact the hospital operator. Locate the [facility] provider you are looking for under [facility] and page to a number that you can be directly reached. If you still have difficulty reaching the provider, please page the [facility] (Director on Call) for the Hospitalists listed on amion for assistance.  09/19/2023, 9:29 AM

## 2023-09-19 NOTE — ED Notes (Signed)
 This tech assisted pt with urinal at bedside. No further needs at this time.

## 2023-09-19 NOTE — ED Notes (Signed)
 PCP from [facility] updated on plan of care and admission status.

## 2023-09-19 NOTE — ED Provider Notes (Signed)
 [facility] Provider Note    Event Date/Time   First MD Initiated Contact with Patient 09/19/23 [phone]     (approximate)   History   Hypertension   HPI  Kevin Burns is a 89 y.o. male brought to the ED via EMS from home with a chief complaint of high blood pressure.  Patient reports compliance with his blood pressure medications although he cannot recall the names of them.  Does not know his baseline blood pressure but states he had a SBP of 200 prior to arrival which is elevated for him.  Denies associated headache, vision changes, neck pain, chest pain, shortness of breath, abdominal pain, nausea, vomiting or dizziness.     Past Medical History   Past Medical History:  Diagnosis Date   GERD (gastroesophageal reflux disease)    Hyperlipidemia    Hypertension    Hypothyroidism    Seasonal allergies    Seizures [facility])      Active Problem List   Patient Active Problem List   Diagnosis Date Noted   Rotator cuff tear arthropathy of right shoulder 12/18/2018     Past Surgical History   Past Surgical History:  Procedure Laterality Date   JOINT REPLACEMENT     right knee   REPLACEMENT TOTAL KNEE     right   TOTAL SHOULDER ARTHROPLASTY Right 12/18/2018   Procedure: TOTAL SHOULDER ARTHROPLASTY;  Surgeon: Lyndle Herrlich, MD;  Location: [facility] ORS;  Service: Orthopedics;  Laterality: Right;     Home Medications   Prior to Admission medications   Medication Sig Start Date End Date Taking? Authorizing Provider  albuterol (VENTOLIN HFA) 108 (90 Base) MCG/ACT inhaler Inhale 2 puffs into the lungs every 4 (four) hours as needed for wheezing or shortness of breath. Every 4-6 hours as needed for wheezing or cough    [provider]  Alum Hydroxide-Mag Trisilicate (GAVISCON) 80-14.2 MG CHEW Chew 1 tablet by mouth 2 (two) times daily. 04/16/22   Arnaldo Natal, MD  amLODipine (NORVASC) 10 MG tablet Take 1 tablet by mouth daily. 11/17/14   [provider]  aspirin 81 MG chewable tablet Chew 81 mg by mouth daily.    [provider]  atorvastatin (LIPITOR) 80 MG tablet Take 1 tablet by mouth daily. 11/18/14   [provider]  bisacodyl (DULCOLAX) 5 MG EC tablet Take 5 mg by mouth daily as needed for moderate constipation. 1-3 as needed for constipation    [provider]  clopidogrel (PLAVIX) 75 MG tablet Take 75 mg by mouth daily.    [provider]  docusate sodium (COLACE) 100 MG capsule Take 100 mg by mouth 2 (two) times daily.    [provider]  FLOVENT HFA 110 MCG/ACT inhaler Inhale 2 puffs into the lungs 2 (two) times daily. 06/29/21   [provider]  fluticasone (FLONASE) 50 MCG/ACT nasal spray Place 2 sprays into both nostrils daily.    [provider]  furosemide (LASIX) 20 MG tablet Take 20 mg by mouth daily.    [provider]  hydrOXYzine (VISTARIL) 50 MG capsule Take 1 capsule (50 mg total) by mouth 3 (three) times daily as needed. 01/21/20   Cuthriell, Delorise Royals, PA-C  levETIRAcetam (KEPPRA) 500 MG tablet Take 5 tablets by mouth daily. 2 tabs in the AM and three in the PM 11/17/14   [provider]  levothyroxine (SYNTHROID) 112 MCG tablet Take 112 mcg by mouth daily before breakfast. 04/14/16  [provider]  losartan (COZAAR) 50 MG tablet Take 1 tablet (50 mg total) by mouth daily. 11/15/22 12/15/22  Georga Hacking, MD  Melatonin 1 MG TABS Take 5 mg by mouth at bedtime as needed.    [provider]  metFORMIN (GLUCOPHAGE) 500 MG tablet Take 500 mg by mouth 2 (two) times daily with a meal.    [provider]  metFORMIN (GLUCOPHAGE-XR) 500 MG 24 hr tablet Take 500 mg by mouth 2 (two) times daily. 07/27/21   [provider]  metoprolol succinate (TOPROL-XL) 100 MG 24 hr tablet Take 50 mg by mouth daily.  11/17/14   [provider]  naphazoline-glycerin (CLEAR EYES REDNESS) 0.012-0.2 % SOLN 1-2 drops 4  (four) times daily as needed for eye irritation.    [provider]  naproxen (NAPROSYN) 500 MG tablet Take 500 mg by mouth 2 (two) times daily as needed.    [provider]  Olopatadine HCl 0.2 % SOLN Apply to eye once. 1 gtt once daily    [provider]  omeprazole (PRILOSEC) 20 MG capsule Take 20 mg by mouth daily. 07/27/21   [provider]  omeprazole (PRILOSEC) 40 MG capsule Take 20 mg by mouth daily.  11/19/14   [provider]  pantoprazole (PROTONIX) 40 MG tablet Take 1 tablet (40 mg total) by mouth daily. 04/16/22 04/16/23  Arnaldo Natal, MD  phenytoin (DILANTIN) 100 MG ER capsule Take 3 capsules by mouth daily. 2 in AM and 1 at [facility] 11/17/14   [provider]  polyethylene glycol (MIRALAX / GLYCOLAX) 17 g packet Take 17 g by mouth daily.    [provider]  Propylene Glycol (SYSTANE BALANCE) 0.6 % SOLN Apply to eye daily as needed. 2-3 drops    [provider]  silodosin (RAPAFLO) 8 MG CAPS capsule Take 1 capsule (8 mg total) by mouth daily with breakfast. 06/04/23   Stoioff, Verna Czech, MD  Skin Protectants, Misc. (EUCERIN) cream Apply topically as needed for dry skin. 01/21/20   Cuthriell, Delorise Royals, PA-C  tamsulosin (FLOMAX) 0.4 MG CAPS capsule Take 1 capsule by mouth daily. 11/17/14   [provider]  traZODone (DESYREL) 50 MG tablet Take 50 mg by mouth at bedtime as needed for sleep. 1/2 tab    [provider]  triamcinolone cream (KENALOG) 0.1 % Apply 1 application topically 4 (four) times daily. 01/21/20   Cuthriell, Delorise Royals, PA-C  triamcinolone cream (KENALOG) 0.5 % Apply 1 application topically daily.    [provider]  triamterene-hydrochlorothiazide (MAXZIDE-25) 37.5-25 MG per tablet Take 1 tablet by mouth daily. 11/19/14   [provider]  VASCEPA 1 G CAPS Take 2 tablets by mouth 2 (two) times a day.  11/18/14   [provider]     Allergies  Patient has no known  allergies.   Family History  History reviewed. No pertinent family history.   Physical Exam  Triage Vital Signs: ED Triage Vitals  Encounter Vitals Group     BP 09/18/23 2341 (!) 156/64     Systolic BP Percentile --      Diastolic BP Percentile --      Pulse Rate 09/18/23 2341 (!) 59     Resp 09/18/23 2341 19     Temp 09/18/23 2341 97.7 F (36.5 C)     Temp Source 09/18/23 2341 Oral     SpO2 09/18/23 2336 98 %     Weight 09/18/23 2342 187 lb (  84.8 kg)     Height 09/18/23 2342 5\' 5"  (1.651 m)     Head Circumference --      Peak Flow --      Pain Score 09/18/23 2342 0     Pain Loc --      Pain Education --      Exclude from Growth Chart --     Updated Vital Signs: BP (!) 171/125 (BP Location: Left Arm)   Pulse (!) 54   Temp 98.4 F (36.9 C) (Oral)   Resp 20   Ht 5\' 5"  (1.651 m)   Wt 84.8 kg   SpO2 98%   BMI 31.12 kg/m    General: Awake, no distress.  CV:  RRR.  Good peripheral perfusion.  Resp:  Normal effort.  CTAB. Abd:  Nontender.  No distention.  Other:  No carotid bruits.  Alert and oriented x 3.  CN II-XII grossly intact.  5/5 motor strength and sensation all extremities.  M AE x 4.   ED Results / Procedures / Treatments  Labs (all labs ordered are listed, but only abnormal results are displayed) Labs Reviewed  CBC WITH DIFFERENTIAL/PLATELET - Abnormal; Notable for the following components:      Result Value   RBC 3.67 (*)    Hemoglobin 11.5 (*)    HCT 35.0 (*)    All other components within normal limits  COMPREHENSIVE METABOLIC PANEL - Abnormal; Notable for the following components:   Glucose, Bld 124 (*)    BUN 26 (*)    Creatinine, Ser 1.30 (*)    Calcium 8.8 (*)    GFR, Estimated 53 (*)    All other components within normal limits  URINALYSIS, ROUTINE W REFLEX MICROSCOPIC - Abnormal; Notable for the following components:   Color, Urine STRAW (*)    APPearance CLEAR (*)    All other components within normal limits  TROPONIN I (HIGH  SENSITIVITY)  TROPONIN I (HIGH SENSITIVITY)     EKG  ED ECG REPORT I, Josean Lycan J, the attending physician, personally viewed and interpreted this ECG.   Date: 09/19/2023  EKG Time: 0334  Rate: 55  Rhythm: sinus bradycardia  Axis: LAD  Intervals:right bundle branch block  ST&T Change: Nonspecific    RADIOLOGY I have independently visualized and interpreted patient's imaging study as well as noted the radiology interpretation:  Chest x-ray: No acute cardiopulmonary process  Official radiology report(s): DG Chest Port 1 View Result Date: 09/19/2023 CLINICAL DATA:  High blood pressure EXAM: PORTABLE CHEST 1 VIEW COMPARISON:  11/24/2022 FINDINGS: Cardiac shadow is stable. Aortic calcifications are noted. The lungs are clear bilaterally. Postsurgical changes in the right shoulder are seen. IMPRESSION: No acute abnormality noted. Electronically Signed   By: Alcide Clever M.D.   On: 09/19/2023 04:01     PROCEDURES:  Critical Care performed: No  .1-3 Lead EKG Interpretation  Performed by: Irean Hong, MD Authorized by: Irean Hong, MD     Interpretation: normal     ECG rate:  60   ECG rate assessment: normal     Rhythm: sinus rhythm     Ectopy: none     Conduction: normal   Comments:     Patient placed on cardiac monitor to evaluate for arrhythmias    MEDICATIONS ORDERED IN ED: Medications  amLODipine (NORVASC) tablet 10 mg (has no administration in time range)  losartan (COZAAR) tablet 50 mg (has no administration in time range)  furosemide (LASIX) tablet 20 mg (  has no administration in time range)  cloNIDine (CATAPRES) tablet 0.1 mg (0.1 mg Oral Given 09/19/23 0357)     IMPRESSION / MDM / ASSESSMENT AND PLAN / ED COURSE  I reviewed the triage vital signs and the nursing notes.                             89 year old male presenting with asymptomatic hypertension.  Differential diagnosis includes but is not limited to medication nonadherence, ACS, metabolic,  infectious etiologies, etc.  I personally reviewed patient's records and note a cardiology office visit from 08/21/2023 for follow-up GERD, hypertension, low back pain, obesity.  Patient's presentation is most consistent with acute complicated illness / injury requiring diagnostic workup.  The patient is on the cardiac monitor to evaluate for evidence of arrhythmia and/or significant heart rate changes.  Laboratory results demonstrate mild AKI.  Have added troponin, chest x-ray, UA.  Blood pressure on arrival 156/64.  Will continue to monitor and care for patient.  Clinical Course as of 09/19/23 0656  Wed Sep 19, 2023  0600 Blood pressure improved.  Patient attempting urine sample. [JS]  [id number] UA negative.  Repeat blood pressure elevated, rechecked on opposite arm.  Have looked through patient's medication list and ordered his morning medications.  Will repeat troponin.  Care will be transferred to the oncoming provider at change of shift pending reassessment. [JS]    Clinical Course User Index [JS] Irean Hong, MD     FINAL CLINICAL IMPRESSION(S) / ED DIAGNOSES   Final diagnoses:  Hypertension, unspecified type     Rx / DC Orders   ED Discharge Orders     None        Note:  This document was prepared using Dragon voice recognition software and may include unintentional dictation errors.   Irean Hong, MD 09/19/23 [phone]

## 2023-09-19 NOTE — ED Provider Notes (Signed)
 7:41 AM Assumed care for off going team.   Blood pressure (!) 173/69, pulse (!) 51, temperature 98.5 F (36.9 C), temperature source Oral, resp. rate 20, height 5\' 5"  (1.651 m), weight 84.8 kg, SpO2 99%.  See their HPI for full report but in brief pending trop and repeat blood pressure if blood pressure still remains significantly elevated would recommend admission  Repeat troponin is stable. Prior creatinine elevated  after receiving the patient's home medications blood pressure still remains significantly elevated even after blood pressure medication.  Given blood pressure has not changed with addition of medications will discuss the hospital team for admission           Concha Se, MD 09/19/23 [phone]

## 2023-09-19 NOTE — ED Notes (Signed)
 Pt son, Kevin Burns, called to check on the pt, pt is currently resting with eyes closed and pt family said not to wake him at this time.

## 2023-09-20 DIAGNOSIS — I160 Hypertensive urgency: Secondary | ICD-10-CM | POA: Diagnosis not present

## 2023-09-20 MED ORDER — AMLODIPINE BESYLATE 10 MG PO TABS: 10.0000 mg | ORAL_TABLET | Freq: Every day | ORAL | 0 refills | Status: AC

## 2023-09-20 MED ORDER — PHENYTOIN SODIUM EXTENDED 100 MG PO CAPS
200.0000 mg | ORAL_CAPSULE | Freq: Every day | ORAL | Status: DC
  Administered 2023-09-20: 200 mg via ORAL
  Filled 2023-09-20: qty 2

## 2023-09-20 MED ORDER — LEVETIRACETAM 500 MG PO TABS: ORAL_TABLET | ORAL | Status: AC

## 2023-09-20 NOTE — Care Management Obs Status (Signed)
 MEDICARE OBSERVATION STATUS NOTIFICATION   Patient Details  Name: Cable Fearn MRN: [id number] Date of Birth: Dec 26, 1931   Medicare Observation Status Notification Given:  Yes    Cherre Blanc, RN 09/20/2023, 2:33 PM

## 2023-09-20 NOTE — Discharge Summary (Signed)
 Physician Discharge Summary   Patient: Kevin Burns MRN: [id number] DOB: [birth year]  Admit date:     09/19/2023  Discharge date: 09/20/23  Discharge Physician: Lurene Shadow   PCP: [facility]   Recommendations at discharge:    Follow up with PCP in 1 week  Discharge Diagnoses: Principal Problem:   Hypertensive urgency  Resolved Problems:   * No resolved hospital problems. [facility] Course:  Kevin Burns is a 89 y.o. male with medical history significant for seizure disorder, hypertension, hypothyroidism, hyperlipidemia, GERD, seasonal allergies, who presented to the ED because of elevated blood pressure.  He said his BP has been elevated despite taking his antihypertensives as prescribed.  He did not report any symptoms.  No chest pain, palpitations, dizziness, headache, confusion, nausea, vomiting, syncope, loss of consciousness, fever, seizure.     Assessment and Plan:  Hypertensive urgency: Blood pressure has improved.  Continue home antihypertensives at discharge.  Increase amlodipine from 5 mg to 10 mg daily.  Follow-up with PCP for further management     Seizure disorder: Continue Keppra and phenytoin     Hypothyroidism: Continue Synthroid    His condition has improved and he is deemed stable for discharge to home today.  Discharge plan was discussed with [name], son, over the phone at patient's bedside       Consultants: None Procedures performed: None Disposition: Home Diet recommendation:  Cardiac diet DISCHARGE MEDICATION: Allergies as of 09/20/2023   No Known Allergies      Medication List     STOP taking these medications    tamsulosin 0.4 MG Caps capsule Commonly known as: FLOMAX       TAKE these medications    albuterol 108 (90 Base) MCG/ACT inhaler Commonly known as: VENTOLIN HFA Inhale 2 puffs into the lungs every 4 (four) hours as needed for wheezing or shortness of breath. Every 4-6 hours as needed for  wheezing or cough   amLODipine 10 MG tablet Commonly known as: NORVASC Take 1 tablet (10 mg total) by mouth daily. What changed:  medication strength how much to take   aspirin 81 MG chewable tablet Chew 81 mg by mouth daily.   atorvastatin 80 MG tablet Commonly known as: LIPITOR Take 1 tablet by mouth daily.   bisacodyl 5 MG EC tablet Commonly known as: DULCOLAX Take 5 mg by mouth daily as needed for moderate constipation. 1-3 as needed for constipation   cetirizine 10 MG tablet Commonly known as: ZYRTEC Take 10 mg by mouth daily.   diclofenac Sodium 1 % Gel Commonly known as: VOLTAREN Apply 4 g topically 4 (four) times daily.   docusate sodium 100 MG capsule Commonly known as: COLACE Take 100 mg by mouth 2 (two) times daily.   fluticasone 50 MCG/ACT nasal spray Commonly known as: FLONASE Place 2 sprays into both nostrils daily.   hydrALAZINE 25 MG tablet Commonly known as: APRESOLINE Take 25 mg by mouth 2 (two) times daily.   levETIRAcetam 500 MG tablet Commonly known as: KEPPRA Take 1000 mg (two tablets) in the morning and 1500 mg (three tablets) in the evening What changed:  how much to take how to take this when to take this   levothyroxine 112 MCG tablet Commonly known as: SYNTHROID Take 112 mcg by mouth daily before breakfast.   losartan 100 MG tablet Commonly known as: COZAAR Take 100 mg by mouth daily.   melatonin 1 MG Tabs tablet Take 5 mg by mouth at bedtime  as needed.   metFORMIN 500 MG 24 hr tablet Commonly known as: GLUCOPHAGE-XR Take 500 mg by mouth 2 (two) times daily.   metoprolol succinate 50 MG 24 hr tablet Commonly known as: TOPROL-XL Take 50 mg by mouth daily.   naphazoline-glycerin 0.012-0.2 % Soln Commonly known as: CLEAR EYES REDNESS 1-2 drops 4 (four) times daily as needed for eye irritation.   Olopatadine HCl 0.2 % Soln Place 1 drop into both eyes daily.   omeprazole 20 MG capsule Commonly known as: PRILOSEC Take 20  mg by mouth daily.   phenytoin 100 MG ER capsule Commonly known as: DILANTIN Take 100-200 mg by mouth 2 (two) times daily. Take 200 mg (two capsules) every morning and 100 mg (one capsule) at bedtime   polyethylene glycol 17 g packet Commonly known as: MIRALAX / GLYCOLAX Take 17 g by mouth daily.   silodosin 8 MG Caps capsule Commonly known as: RAPAFLO Take 1 capsule (8 mg total) by mouth daily with breakfast.   Systane Balance 0.6 % Soln Generic drug: Propylene Glycol Apply 2-3 drops to eye daily as needed (Dry Eyes).   triamcinolone cream 0.1 % Commonly known as: KENALOG Apply 1 application topically 4 (four) times daily.   triamterene-hydrochlorothiazide 37.5-25 MG tablet Commonly known as: MAXZIDE-25 Take 1 tablet by mouth daily.        Follow-up Information     [facility] Follow up.   Specialty: General Practice Why: hospital follow up Contact information: [address] [place] [address] [phone]                Discharge Exam: [name] Weights   09/18/23 2342  Weight: 84.8 kg   GEN: NAD SKIN: Warm and dry EYES: No pallor or icterus ENT: MMM CV: RRR PULM: CTA B ABD: soft, obese, NT, +BS CNS: AAO x 3, non focal EXT: No edema or tenderness   Condition at discharge: good  The results of significant diagnostics from this hospitalization (including imaging, microbiology, ancillary and laboratory) are listed below for reference.   Imaging Studies: DG Chest Port 1 View Result Date: 09/19/2023 CLINICAL DATA:  High blood pressure EXAM: PORTABLE CHEST 1 VIEW COMPARISON:  11/24/2022 FINDINGS: Cardiac shadow is stable. Aortic calcifications are noted. The lungs are clear bilaterally. Postsurgical changes in the right shoulder are seen. IMPRESSION: No acute abnormality noted. Electronically Signed   By: Alcide Clever M.D.   On: 09/19/2023 04:01    Microbiology: Results for orders placed or performed in visit  on 06/04/23  Microscopic Examination     Status: Abnormal   Collection Time: 06/04/23  1:08 PM   Urine  Result Value Ref Range Status   WBC, UA 6-10 (A) 0 - 5 /hpf Final   RBC, Urine 0-2 0 - 2 /hpf Final   Epithelial Cells (non renal) 0-10 0 - 10 /hpf Final   Casts Present (A) None seen /lpf Final   Cast Type Granular casts (A) N/A Final    Comment: Hyaline casts   Bacteria, UA Few None seen/Few Final  CULTURE, URINE COMPREHENSIVE     Status: Abnormal   Collection Time: 06/04/23  1:43 PM   Specimen: Urine   UR  Result Value Ref Range Status   Urine Culture, Comprehensive Final report (A)  Final   Organism ID, Bacteria Klebsiella pneumoniae (A)  Final    Comment: Cefazolin <=4 ug/mL Cefazolin with an MIC <=16 predicts susceptibility to the oral agents cefaclor, cefdinir, cefpodoxime,  cefprozil, cefuroxime, cephalexin, and loracarbef when used for therapy of uncomplicated urinary tract infections due to E. coli, Klebsiella pneumoniae, and Proteus mirabilis. Greater than 100,000 colony forming units per mL    Organism ID, Bacteria Comment  Final    Comment: Mixed urogenital flora 3,000 Colonies/mL    ANTIMICROBIAL SUSCEPTIBILITY Comment  Final    Comment:       ** S = Susceptible; I = Intermediate; R = Resistant **                    P = Positive; N = Negative             MICS are expressed in micrograms per mL    Antibiotic                 RSLT#1    RSLT#2    RSLT#3    RSLT#4 Amoxicillin/Clavulanic Acid    S Ampicillin                     R Cefepime                       S Ceftriaxone                    S Cefuroxime                     S Ciprofloxacin                  S Ertapenem                      S Gentamicin                     S Imipenem                       S Levofloxacin                   S Meropenem                      S Nitrofurantoin                 R Piperacillin/Tazobactam        S Tetracycline                   S Tobramycin                      S Trimethoprim/Sulfa             S     Labs: CBC: Recent Labs  Lab 09/18/23 2345  WBC 4.7  NEUTROABS 2.9  HGB 11.5*  HCT 35.0*  MCV 95.4  PLT 163   Basic Metabolic Panel: Recent Labs  Lab 09/18/23 2345 09/19/23 0702  NA 135  --   K 3.9  --   CL 105  --   CO2 23  --   GLUCOSE 124*  --   BUN 26*  --   CREATININE 1.30* 1.17  CALCIUM 8.8*  --    Liver Function Tests: Recent Labs  Lab 09/18/23 2345  AST 31  ALT 16  ALKPHOS 79  BILITOT 0.3  PROT 7.4  ALBUMIN 3.9   CBG: No results for input(s): "GLUCAP" in the last 168 hours.  Discharge time spent:  greater than 30 minutes.  Signed: Lurene Shadow, MD [facility] 09/20/2023

## 2023-09-20 NOTE — Plan of Care (Signed)
  Problem: Education: Goal: Knowledge of General Education information will improve Description: Including pain rating scale, medication(s)/side effects and non-pharmacologic comfort measures Outcome: Progressing   Problem: Health Behavior/Discharge Planning: Goal: Ability to manage health-related needs will improve Outcome: Progressing   Problem: Clinical Measurements: Goal: Ability to maintain clinical measurements within normal limits will improve Outcome: Progressing   Problem: Activity: Goal: Risk for activity intolerance will decrease Outcome: Progressing   Problem: Nutrition: Goal: Adequate nutrition will be maintained Outcome: Progressing   Problem: Coping: Goal: Level of anxiety will decrease Outcome: Progressing   Problem: Pain Managment: Goal: General experience of comfort will improve and/or be controlled Outcome: Progressing   Problem: Safety: Goal: Ability to remain free from injury will improve Outcome: Progressing   Problem: Skin Integrity: Goal: Risk for impaired skin integrity will decrease Outcome: Progressing

## 2023-09-20 NOTE — Progress Notes (Signed)
 Patient arrived to floor alert and oriented on stretcher by [facility], Ambulated to bathroom with cane. No distress noted. IV intact to left AC intact. Orientated to room and call bell.

## 2024-02-03 ENCOUNTER — Other Ambulatory Visit: Payer: Self-pay

## 2024-02-03 ENCOUNTER — Emergency Department
Admission: EM | Admit: 2024-02-03 | Discharge: 2024-02-03 | Disposition: A | Discharge status: Home / Self Care | Attending: Emergency Medicine | Admitting: Emergency Medicine

## 2024-02-03 DIAGNOSIS — G4700 Insomnia, unspecified: Secondary | ICD-10-CM | POA: Diagnosis not present

## 2024-02-03 DIAGNOSIS — I10 Essential (primary) hypertension: Secondary | ICD-10-CM | POA: Insufficient documentation

## 2024-02-03 DIAGNOSIS — R251 Tremor, unspecified: Secondary | ICD-10-CM | POA: Insufficient documentation

## 2024-02-03 DIAGNOSIS — Z7984 Long term (current) use of oral hypoglycemic drugs: Secondary | ICD-10-CM | POA: Insufficient documentation

## 2024-02-03 DIAGNOSIS — Z79899 Other long term (current) drug therapy: Secondary | ICD-10-CM | POA: Diagnosis not present

## 2024-02-03 LAB — COMPREHENSIVE METABOLIC PANEL WITH GFR
ALT: 11 U/L (ref 0–44)
AST: 28 U/L (ref 15–41)
Albumin: 3.6 g/dL (ref 3.5–5.0)
Alkaline Phosphatase: 80 U/L (ref 38–126)
Anion gap: 8 (ref 5–15)
BUN: 24 mg/dL — ABNORMAL HIGH (ref 8–23)
CO2: 24 mmol/L (ref 22–32)
Calcium: 8.8 mg/dL — ABNORMAL LOW (ref 8.9–10.3)
Chloride: 103 mmol/L (ref 98–111)
Creatinine, Ser: 1.15 mg/dL (ref 0.61–1.24)
GFR, Estimated: >60 mL/min (ref >60)
Glucose, Bld: 203 mg/dL — ABNORMAL HIGH (ref 70–99)
Potassium: 3.4 mmol/L — ABNORMAL LOW (ref 3.5–5.1)
Sodium: 135 mmol/L (ref 135–145)
Total Bilirubin: 0.6 mg/dL (ref 0.0–1.2)
Total Protein: 7.2 g/dL (ref 6.5–8.1)

## 2024-02-03 LAB — CBC WITH DIFFERENTIAL/PLATELET
Abs Immature Granulocytes: 0.01 K/uL (ref 0.00–0.07)
Basophils Absolute: 0 K/uL (ref 0.0–0.1)
Basophils Relative: 1 %
Eosinophils Absolute: 0.3 K/uL (ref 0.0–0.5)
Eosinophils Relative: 6 %
HCT: 34 % — ABNORMAL LOW (ref 39.0–52.0)
Hemoglobin: 11 g/dL — ABNORMAL LOW (ref 13.0–17.0)
Immature Granulocytes: 0 %
Lymphocytes Relative: 37 %
Lymphs Abs: 1.7 K/uL (ref 0.7–4.0)
MCH: 31.3 pg (ref 26.0–34.0)
MCHC: 32.4 g/dL (ref 30.0–36.0)
MCV: 96.9 fL (ref 80.0–100.0)
Monocytes Absolute: 0.4 K/uL (ref 0.1–1.0)
Monocytes Relative: 8 %
Neutro Abs: 2.3 K/uL (ref 1.7–7.7)
Neutrophils Relative %: 48 %
Platelets: 198 K/uL (ref 150–400)
RBC: 3.51 MIL/uL — ABNORMAL LOW (ref 4.22–5.81)
RDW: 13.2 % (ref 11.5–15.5)
WBC: 4.7 K/uL (ref 4.0–10.5)
nRBC: 0 % (ref 0.0–0.2)

## 2024-02-03 LAB — PHENYTOIN LEVEL, TOTAL: Phenytoin Lvl: 10.4 ug/mL (ref 10.0–20.0)

## 2024-02-03 LAB — CBG MONITORING, ED: Glucose-Capillary: 183 mg/dL — ABNORMAL HIGH (ref 70–99)

## 2024-02-03 MED ORDER — TRAZODONE HCL 50 MG PO TABS: 50.0000 mg | ORAL_TABLET | Freq: Every evening | ORAL | 0 refills | Status: AC | PRN

## 2024-02-03 NOTE — ED Triage Notes (Addendum)
 Family reports patients metformin  was lowered a couple days ago and now having twitching in his arms.  Patient also having HTN.  Patient denies complaint. Patient just states I feel uneasy and nervous.

## 2024-02-03 NOTE — ED Provider Notes (Signed)
 [facility] Provider Note    Event Date/Time   First MD Initiated Contact with Patient 02/03/24 1010     (approximate)  History   Chief Complaint: Tremors  HPI  Kevin Burns is a 89 y.o. male with a past medical history of gastric reflux, hypertension, hyperlipidemia, seizure disorder, presents to the emergency department for muscle twitching and lack of sleep.  According to the son and the patient Friday the patient's medications were changed by his physician.  I reviewed the medication changes that the son brought on a piece of paper and it appears that the only change was reducing metformin  from 1000 mg daily to 500 mg daily.  Patient states he could not sleep last night and he blames the medication changes on this.  Son states this morning the patient has been twitching at times where he says his arm will twitch every 30 minutes or an hour or so.  Denies seizure activity states he will just be 1 or 2 twitches and then it will stop.  Patient states he has not slept since Friday night which again he blames the medication changes for this.  Physical Exam   Triage Vital Signs: ED Triage Vitals  Encounter Vitals Group     BP 02/03/24 0936 (!) 176/63     Girls Systolic BP Percentile --      Girls Diastolic BP Percentile --      Boys Systolic BP Percentile --      Boys Diastolic BP Percentile --      Pulse Rate 02/03/24 0936 62     Resp 02/03/24 0936 (!) 22     Temp 02/03/24 0936 98.2 F (36.8 C)     Temp Source 02/03/24 0936 Oral     SpO2 02/03/24 0936 98 %     Weight 02/03/24 0935 187 lb (84.8 kg)     Height 02/03/24 0935 5' 5 (1.651 m)     Head Circumference --      Peak Flow --      Pain Score 02/03/24 0935 0     Pain Loc --      Pain Education --      Exclude from Growth Chart --     Most recent vital signs: Vitals:   02/03/24 0936  BP: (!) 176/63  Pulse: 62  Resp: (!) 22  Temp: 98.2 F (36.8 C)  SpO2: 98%    General: Awake, no distress.   CV:  Good peripheral perfusion.  Regular rate and rhythm  Resp:  Normal effort.  Equal breath sounds bilaterally.  Abd:  No distention.  Soft, nontender.  No rebound or guarding.  ED Results / Procedures / Treatments   MEDICATIONS ORDERED IN ED: Medications - No data to display   IMPRESSION / MDM / ASSESSMENT AND PLAN / ED COURSE  I reviewed the triage vital signs and the nursing notes.  Patient's presentation is most consistent with acute presentation with potential threat to life or bodily function.  Patient presents to the emergency department for twitching and lack of sleep.  Has not slept since Friday night.  Patient started taking his new medications on Saturday however I reviewed the patient's current medication list and his list brought in by the son, the only change appears to be metformin  reduced from 1000 mg daily to 500 mg daily.  Patient has a reassuring physical exam.  The twitching described by the patient and the son appears more consistent with likely lack  of sleep that it does with any type of medication withdrawal, does not appear consistent with any type of seizure activity, did not occur during my evaluation however.  We will check labs we will continue to closely monitor the patient in the emergency department.  I suspect that the majority of the patient's symptoms are likely related more to lack of sleep then the medication changes.  Patient has an appointment back with his primary care doctor tomorrow.  Patient's workup shows a reassuring CBC, overall reassuring chemistry slight hyperglycemia.  Phenytoin  level is normal.  Again highly suspect this to be more sleep/insomnia related.  Will have the patient follow-up with his PCP tomorrow.  Will prescribe a short course of trazodone  50 mg to be used at night as needed for insomnia.  Patient agreeable to plan.  FINAL CLINICAL IMPRESSION(S) / ED DIAGNOSES   Insomnia   Note:  This document was prepared using Dragon voice  recognition software and may include unintentional dictation errors.   Dorothyann Drivers, MD 02/03/24 1125

## 2024-02-03 NOTE — Discharge Instructions (Signed)
 You have been prescribed a sleep medication to be used if needed for trouble sleeping.  Return to the emergency department for any worsening symptoms.  Your workup in the emergency department has shown reassuring results.  Please follow-up with your doctor tomorrow as scheduled.

## 2024-02-05 LAB — LEVETIRACETAM LEVEL: Levetiracetam Lvl: 47.6 ug/mL — ABNORMAL HIGH (ref 10.0–40.0)

## 2024-07-23 ENCOUNTER — Ambulatory Visit: Admitting: Physician Assistant

## 2024-08-04 ENCOUNTER — Ambulatory Visit: Admitting: Physician Assistant
